# Patient Record
Sex: Female | Born: 1975 | Race: Black or African American | Hispanic: No | Marital: Married | Smoking: Never smoker
Health system: Southern US, Community
[De-identification: ages and names within clinical notes are randomized; demographics above are authoritative.]

## PROBLEM LIST (undated history)

## (undated) ENCOUNTER — Telehealth

## (undated) ENCOUNTER — Ambulatory Visit
Payer: PRIVATE HEALTH INSURANCE | Attending: Physical Medicine & Rehabilitation | Primary: Physical Medicine & Rehabilitation

## (undated) ENCOUNTER — Telehealth: Attending: Family | Primary: Family

## (undated) ENCOUNTER — Encounter

## (undated) ENCOUNTER — Ambulatory Visit: Payer: PRIVATE HEALTH INSURANCE

## (undated) ENCOUNTER — Encounter
Attending: Student in an Organized Health Care Education/Training Program | Primary: Student in an Organized Health Care Education/Training Program

## (undated) ENCOUNTER — Encounter: Attending: Physical Medicine & Rehabilitation | Primary: Physical Medicine & Rehabilitation

## (undated) ENCOUNTER — Ambulatory Visit

## (undated) ENCOUNTER — Encounter: Attending: Internal Medicine | Primary: Internal Medicine

## (undated) ENCOUNTER — Encounter: Attending: Clinical | Primary: Clinical

## (undated) ENCOUNTER — Encounter: Attending: Family | Primary: Family

## (undated) ENCOUNTER — Ambulatory Visit
Payer: BLUE CROSS/BLUE SHIELD | Attending: Physical Medicine & Rehabilitation | Primary: Physical Medicine & Rehabilitation

## (undated) ENCOUNTER — Ambulatory Visit: Payer: BLUE CROSS/BLUE SHIELD | Attending: Registered" | Primary: Registered"

## (undated) ENCOUNTER — Ambulatory Visit
Payer: PRIVATE HEALTH INSURANCE | Attending: Rehabilitative and Restorative Service Providers" | Primary: Rehabilitative and Restorative Service Providers"

## (undated) ENCOUNTER — Ambulatory Visit: Payer: PRIVATE HEALTH INSURANCE | Attending: Anesthesiology | Primary: Anesthesiology

## (undated) ENCOUNTER — Ambulatory Visit
Payer: BLUE CROSS/BLUE SHIELD | Attending: Student in an Organized Health Care Education/Training Program | Primary: Student in an Organized Health Care Education/Training Program

## (undated) ENCOUNTER — Ambulatory Visit: Payer: BLUE CROSS/BLUE SHIELD

## (undated) ENCOUNTER — Ambulatory Visit: Payer: BLUE CROSS/BLUE SHIELD | Attending: Family | Primary: Family

## (undated) ENCOUNTER — Ambulatory Visit: Payer: PRIVATE HEALTH INSURANCE | Attending: Family | Primary: Family

## (undated) ENCOUNTER — Ambulatory Visit: Payer: BLUE CROSS/BLUE SHIELD | Attending: Internal Medicine | Primary: Internal Medicine

## (undated) ENCOUNTER — Ambulatory Visit: Attending: Pharmacist | Primary: Pharmacist

## (undated) ENCOUNTER — Telehealth: Attending: Family Medicine | Primary: Family Medicine

## (undated) ENCOUNTER — Ambulatory Visit: Payer: PRIVATE HEALTH INSURANCE | Attending: Internal Medicine | Primary: Internal Medicine

## (undated) ENCOUNTER — Encounter: Attending: Family Medicine | Primary: Family Medicine

## (undated) ENCOUNTER — Encounter
Payer: PRIVATE HEALTH INSURANCE | Attending: Physical Medicine & Rehabilitation | Primary: Physical Medicine & Rehabilitation

## (undated) ENCOUNTER — Encounter
Payer: PRIVATE HEALTH INSURANCE | Attending: Student in an Organized Health Care Education/Training Program | Primary: Student in an Organized Health Care Education/Training Program

## (undated) ENCOUNTER — Encounter: Attending: Registered" | Primary: Registered"

## (undated) ENCOUNTER — Encounter
Payer: BLUE CROSS/BLUE SHIELD | Attending: Student in an Organized Health Care Education/Training Program | Primary: Student in an Organized Health Care Education/Training Program

## (undated) ENCOUNTER — Encounter: Attending: Anesthesiology | Primary: Anesthesiology

## (undated) ENCOUNTER — Other Ambulatory Visit

## (undated) ENCOUNTER — Telehealth
Attending: Student in an Organized Health Care Education/Training Program | Primary: Student in an Organized Health Care Education/Training Program

## (undated) ENCOUNTER — Encounter: Attending: Dermatology | Primary: Dermatology

## (undated) ENCOUNTER — Ambulatory Visit
Attending: Rehabilitative and Restorative Service Providers" | Primary: Rehabilitative and Restorative Service Providers"

## (undated) ENCOUNTER — Encounter: Attending: Diagnostic Radiology | Primary: Diagnostic Radiology

## (undated) ENCOUNTER — Telehealth: Attending: Nephrology | Primary: Nephrology

## (undated) ENCOUNTER — Ambulatory Visit: Attending: Family Medicine | Primary: Family Medicine

## (undated) ENCOUNTER — Ambulatory Visit: Payer: Medicaid (Managed Care)

## (undated) ENCOUNTER — Telehealth: Payer: PRIVATE HEALTH INSURANCE

## (undated) DIAGNOSIS — G43909 Migraine, unspecified, not intractable, without status migrainosus: Secondary | ICD-10-CM

---

## 2004-05-20 ENCOUNTER — Emergency Department: Payer: Self-pay | Admitting: Emergency Medicine

## 2004-06-24 ENCOUNTER — Emergency Department: Payer: Self-pay | Admitting: Emergency Medicine

## 2005-03-21 ENCOUNTER — Other Ambulatory Visit: Payer: Self-pay

## 2005-03-21 ENCOUNTER — Emergency Department: Payer: Self-pay | Admitting: Emergency Medicine

## 2005-03-26 ENCOUNTER — Emergency Department: Payer: Self-pay | Admitting: Emergency Medicine

## 2005-05-24 ENCOUNTER — Emergency Department: Payer: Self-pay | Admitting: Emergency Medicine

## 2006-02-06 ENCOUNTER — Other Ambulatory Visit: Payer: Self-pay

## 2006-02-06 ENCOUNTER — Emergency Department: Payer: Self-pay | Admitting: Emergency Medicine

## 2006-04-04 ENCOUNTER — Emergency Department: Payer: Self-pay | Admitting: General Practice

## 2006-05-12 ENCOUNTER — Emergency Department: Payer: Self-pay | Admitting: Emergency Medicine

## 2006-06-25 ENCOUNTER — Emergency Department: Payer: Self-pay | Admitting: Emergency Medicine

## 2006-07-13 ENCOUNTER — Emergency Department: Payer: Self-pay | Admitting: Emergency Medicine

## 2006-08-07 ENCOUNTER — Emergency Department: Payer: Self-pay

## 2006-10-05 ENCOUNTER — Emergency Department: Payer: Self-pay | Admitting: Unknown Physician Specialty

## 2007-01-04 ENCOUNTER — Observation Stay: Payer: Self-pay | Admitting: General Surgery

## 2007-01-15 ENCOUNTER — Ambulatory Visit: Payer: Self-pay | Admitting: Obstetrics & Gynecology

## 2007-01-21 ENCOUNTER — Ambulatory Visit: Payer: Self-pay | Admitting: Obstetrics & Gynecology

## 2007-05-30 ENCOUNTER — Emergency Department: Payer: Self-pay | Admitting: Unknown Physician Specialty

## 2007-08-14 ENCOUNTER — Emergency Department: Payer: Self-pay | Admitting: Internal Medicine

## 2008-04-26 ENCOUNTER — Emergency Department: Payer: Self-pay | Admitting: Emergency Medicine

## 2009-01-08 ENCOUNTER — Emergency Department: Payer: Self-pay | Admitting: Internal Medicine

## 2009-02-28 ENCOUNTER — Emergency Department: Payer: Self-pay | Admitting: Emergency Medicine

## 2009-06-22 ENCOUNTER — Emergency Department: Payer: Self-pay | Admitting: Emergency Medicine

## 2011-01-14 IMAGING — CT CT HEAD WITHOUT CONTRAST
2 series · 16 of 30 positions shown, 20 images · non-contrast
Comparison: none

REASON FOR EXAM: ha dizzy
COMMENTS:

PROCEDURE:     CT  - CT HEAD WITHOUT CONTRAST  - January 08, 2009  [DATE]
RESULT:     Comparison:  None
TECHNIQUE: Multiple axial images from the foramen magnum to the vertex were
obtained without IV contrast.

[Series 2: without · axial · non-contrast · 0.38mm/px · z∈[+479,+599]mm · 13 of 29 slices shown, 17 images]
[im 3/29  brain]
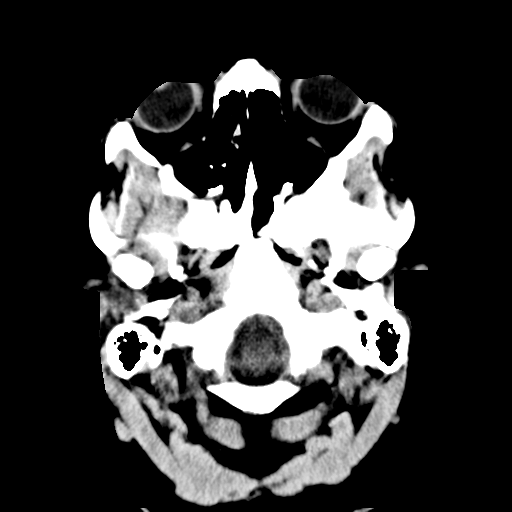
[im 3/29  bone]
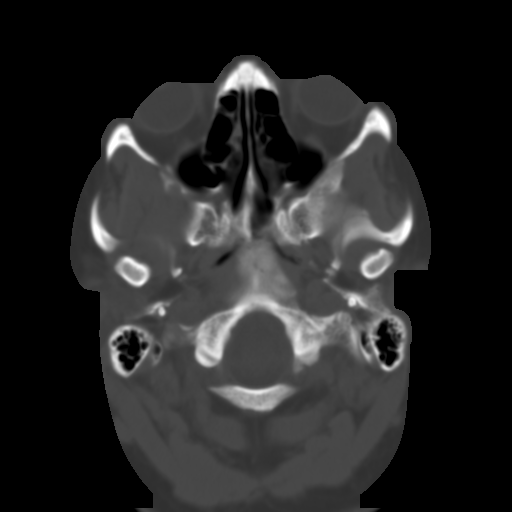
[im 5/29  brain]
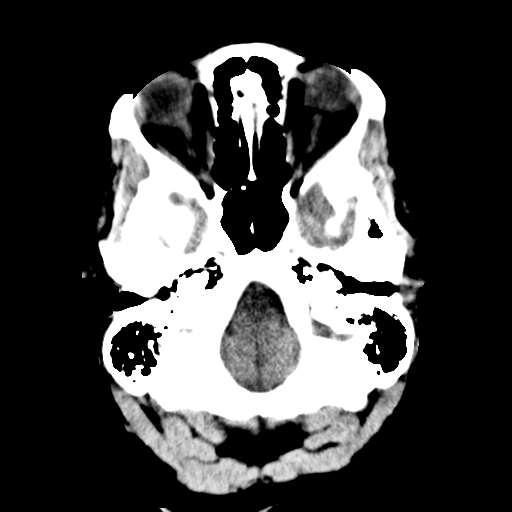
[im 7/29  brain]
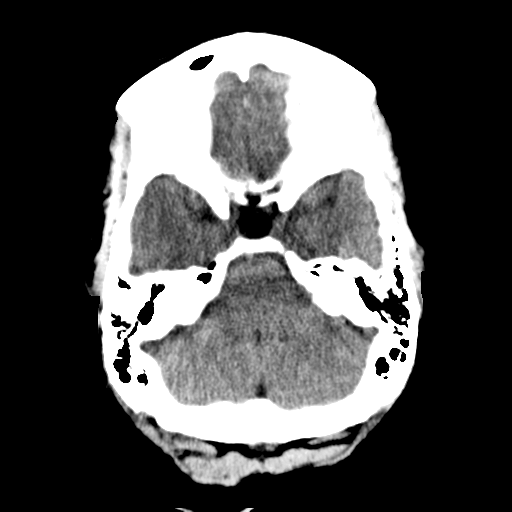
[im 9/29  brain]
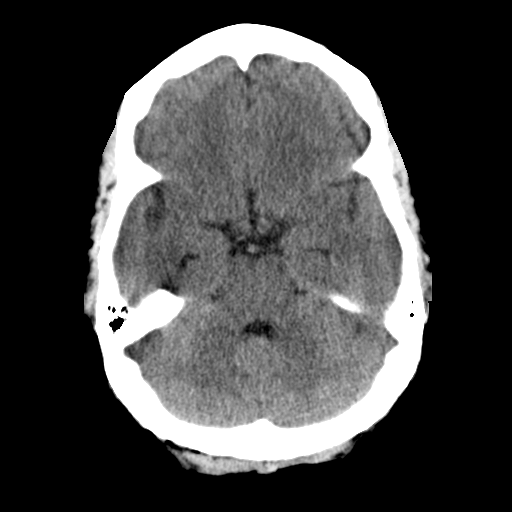
[im 11/29  brain]
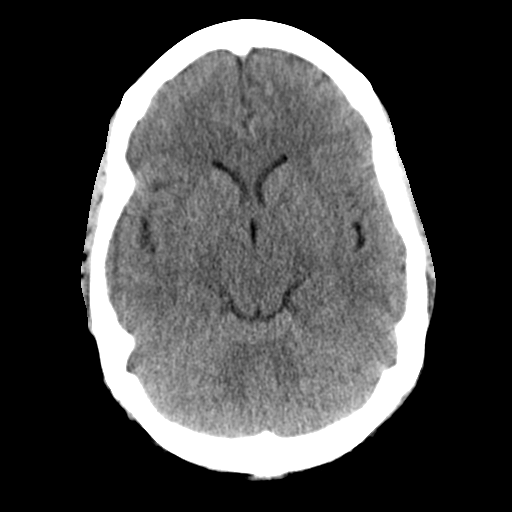
[im 11/29  bone]
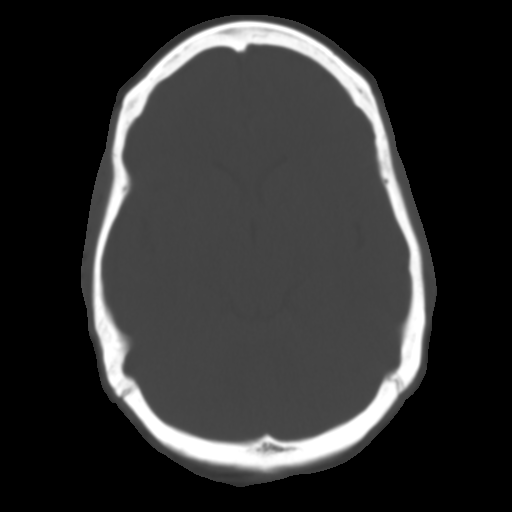
[im 13/29  brain]
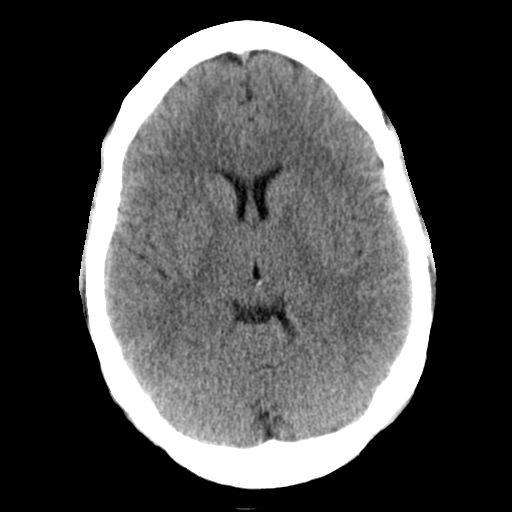
[im 15/29  brain]
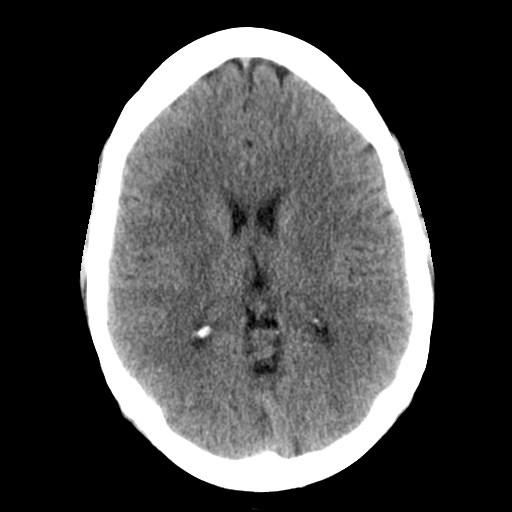
[im 17/29  brain]
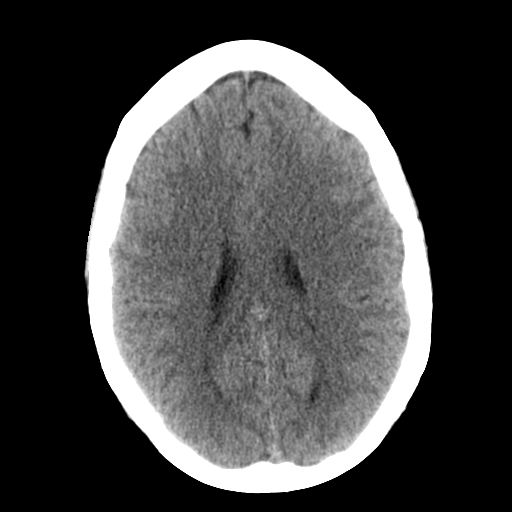
[im 19/29  brain]
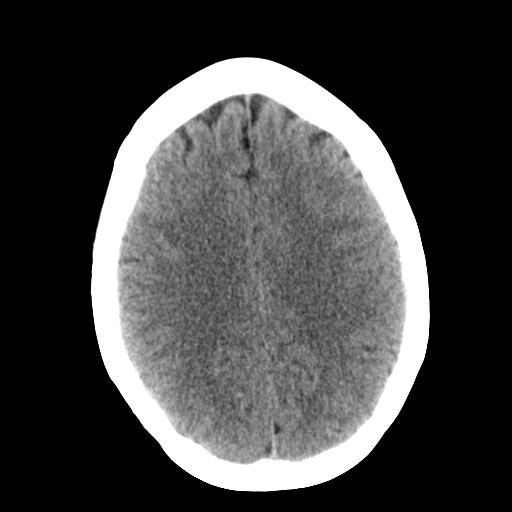
[im 19/29  bone]
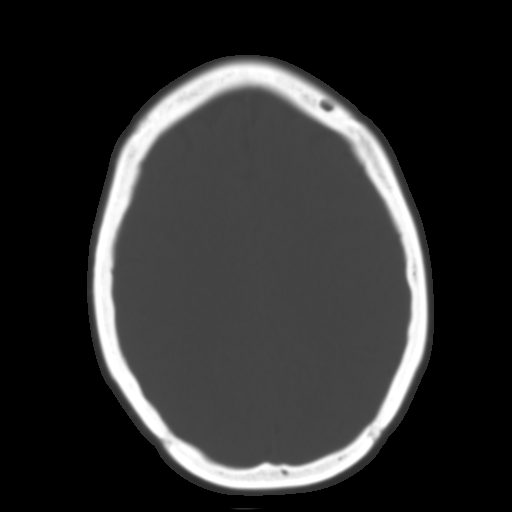
[im 21/29  brain]
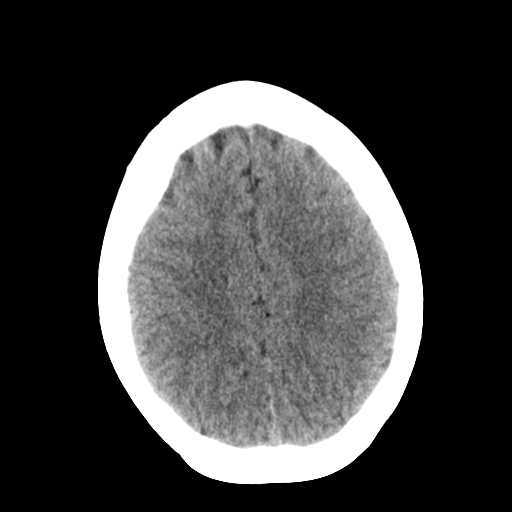
[im 23/29  brain]
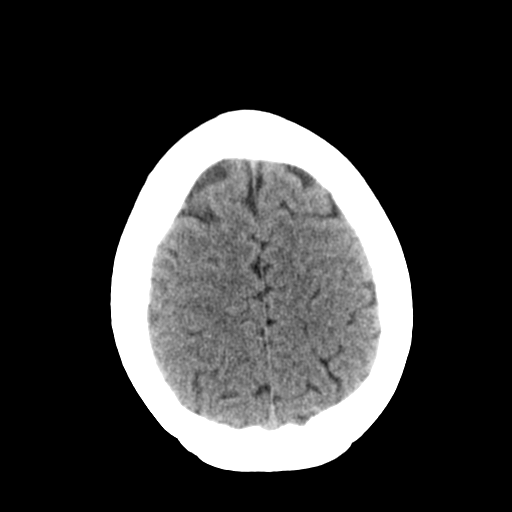
[im 25/29  brain]
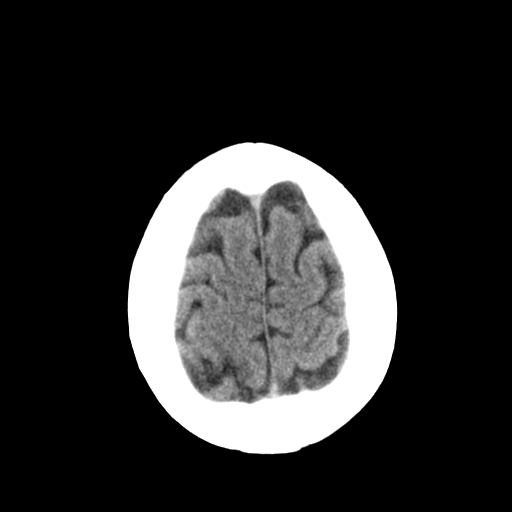
[im 27/29  brain]
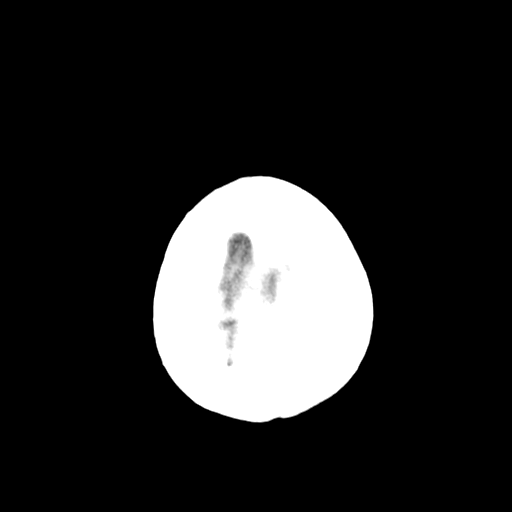
[im 27/29  bone]
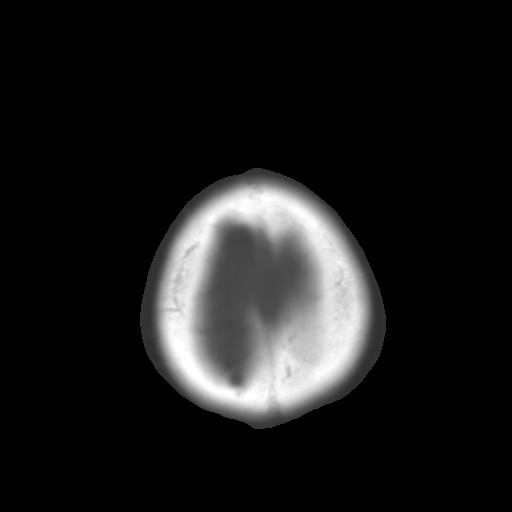

[Series 3: bone · axial · 0.38mm/px · z∈[+479,+519]mm · 3 of 29 slices shown]
[im 3/29  bone]
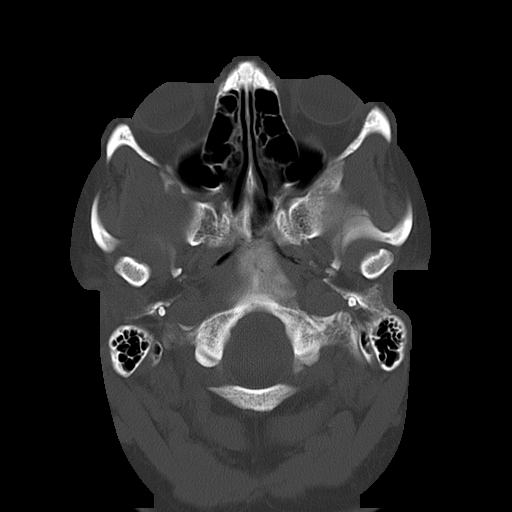
[im 7/29  bone]
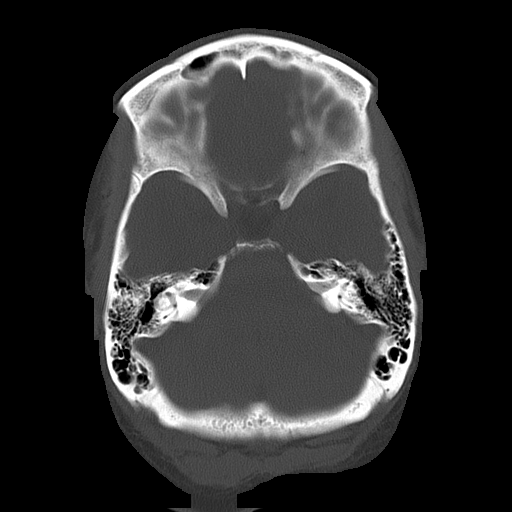
[im 11/29  bone]
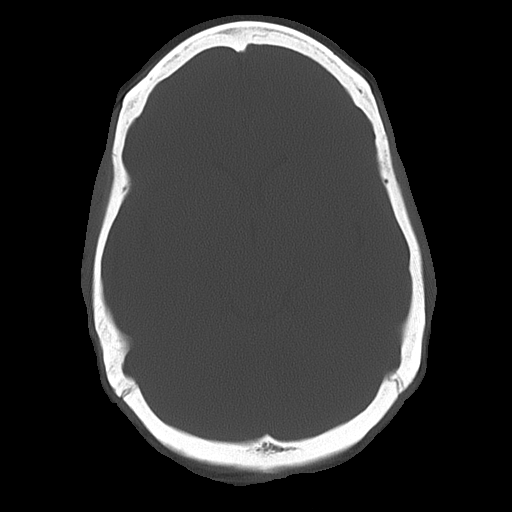

[16 of 30 positions shown; findings below may reference images not displayed]

FINDINGS: There is no evidence of mass effect, midline shift, or extra-axial fluid
collections.  There is no evidence of a space-occupying lesion or
intracranial hemorrhage. There is no evidence of a cortical-based area of
acute infarction.

The ventricles and sulci are appropriate for the patient's age. The basal
cisterns are patent.

Visualized portions of the orbits are unremarkable. The paranasal sinuses
and mastoid air cells are unremarkable.

The osseous structures are unremarkable.
IMPRESSION: No acute intracranial process.

## 2011-04-11 ENCOUNTER — Inpatient Hospital Stay: Payer: Self-pay | Admitting: Internal Medicine

## 2011-05-24 ENCOUNTER — Emergency Department: Payer: Self-pay | Admitting: *Deleted

## 2011-06-21 ENCOUNTER — Ambulatory Visit: Payer: Self-pay | Admitting: Gastroenterology

## 2011-09-24 ENCOUNTER — Emergency Department: Payer: Self-pay | Admitting: Emergency Medicine

## 2012-01-21 ENCOUNTER — Ambulatory Visit: Payer: Self-pay | Admitting: Obstetrics and Gynecology

## 2012-01-24 ENCOUNTER — Emergency Department: Payer: Self-pay | Admitting: Emergency Medicine

## 2012-01-30 ENCOUNTER — Ambulatory Visit: Payer: Self-pay | Admitting: Obstetrics and Gynecology

## 2012-03-20 ENCOUNTER — Ambulatory Visit: Payer: Self-pay | Admitting: Obstetrics and Gynecology

## 2012-03-20 LAB — BASIC METABOLIC PANEL
Anion Gap: 8 (ref 7–16)
Calcium, Total: 8.7 mg/dL (ref 8.5–10.1)
Co2: 27 mmol/L (ref 21–32)
Creatinine: 0.7 mg/dL (ref 0.60–1.30)
EGFR (African American): >60
Osmolality: 280 (ref 275–301)
Sodium: 141 mmol/L (ref 136–145)

## 2012-03-20 LAB — PREGNANCY, URINE: Pregnancy Test, Urine: NEGATIVE m[IU]/mL

## 2012-03-27 ENCOUNTER — Ambulatory Visit: Payer: Self-pay | Admitting: Obstetrics and Gynecology

## 2012-03-30 ENCOUNTER — Inpatient Hospital Stay: Payer: Self-pay | Admitting: Obstetrics and Gynecology

## 2012-03-30 LAB — URINALYSIS, COMPLETE
Leukocyte Esterase: NEGATIVE
Nitrite: NEGATIVE
Ph: 5 (ref 4.5–8.0)
Protein: 100
RBC,UR: 1 /HPF (ref 0–5)
Specific Gravity: 1.029 (ref 1.003–1.030)
Squamous Epithelial: 2

## 2012-03-30 LAB — COMPREHENSIVE METABOLIC PANEL
Anion Gap: 12 (ref 7–16)
Bilirubin,Total: 1.7 mg/dL — ABNORMAL HIGH (ref 0.2–1.0)
Chloride: 103 mmol/L (ref 98–107)
Co2: 21 mmol/L (ref 21–32)
Creatinine: 1.07 mg/dL (ref 0.60–1.30)
EGFR (African American): >60
EGFR (Non-African Amer.): >60
Osmolality: 273 (ref 275–301)
Potassium: 2.8 mmol/L — ABNORMAL LOW (ref 3.5–5.1)
SGPT (ALT): 17 U/L (ref 12–78)
Sodium: 136 mmol/L (ref 136–145)

## 2012-03-30 LAB — CBC
HCT: 35.2 % (ref 35.0–47.0)
HGB: 11.7 g/dL — ABNORMAL LOW (ref 12.0–16.0)
MCHC: 33.1 g/dL (ref 32.0–36.0)
MCV: 81 fL (ref 80–100)
Platelet: 144 10*3/uL — ABNORMAL LOW (ref 150–440)
RDW: 15.2 % — ABNORMAL HIGH (ref 11.5–14.5)
WBC: 16.8 10*3/uL — ABNORMAL HIGH (ref 3.6–11.0)

## 2012-03-30 LAB — MAGNESIUM: Magnesium: 1.3 mg/dL — ABNORMAL LOW

## 2012-03-30 LAB — LIPASE, BLOOD: Lipase: 101 U/L (ref 73–393)

## 2012-03-31 LAB — CBC WITH DIFFERENTIAL/PLATELET
Basophil %: 0.3 %
Eosinophil #: 0 10*3/uL (ref 0.0–0.7)
MCH: 26.4 pg (ref 26.0–34.0)
MCHC: 31.9 g/dL — ABNORMAL LOW (ref 32.0–36.0)
Monocyte #: 1.6 x10 3/mm — ABNORMAL HIGH (ref 0.2–0.9)
Monocyte %: 10.1 %
Neutrophil %: 80.3 %
Platelet: 107 10*3/uL — ABNORMAL LOW (ref 150–440)
RDW: 15 % — ABNORMAL HIGH (ref 11.5–14.5)

## 2012-03-31 LAB — BASIC METABOLIC PANEL
Anion Gap: 8 (ref 7–16)
Calcium, Total: 7.7 mg/dL — ABNORMAL LOW (ref 8.5–10.1)
Chloride: 105 mmol/L (ref 98–107)
Co2: 26 mmol/L (ref 21–32)
Creatinine: 0.77 mg/dL (ref 0.60–1.30)
EGFR (African American): >60
Osmolality: 275 (ref 275–301)

## 2012-04-01 LAB — CBC WITH DIFFERENTIAL/PLATELET
Basophil #: 0.1 10*3/uL (ref 0.0–0.1)
Lymphocyte #: 1.5 10*3/uL (ref 1.0–3.6)
Lymphocyte %: 15.9 %
MCV: 82 fL (ref 80–100)
Monocyte %: 11.9 %
Platelet: 104 10*3/uL — ABNORMAL LOW (ref 150–440)
RDW: 15.2 % — ABNORMAL HIGH (ref 11.5–14.5)
WBC: 9.6 10*3/uL (ref 3.6–11.0)

## 2012-04-01 LAB — GENTAMICIN LEVEL, PEAK: Gentamicin, Peak: 4.9 ug/mL (ref 4.0–8.0)

## 2012-04-01 LAB — URINE CULTURE

## 2012-04-01 LAB — GENTAMICIN LEVEL, TROUGH: Gentamicin, Trough: 0.5 ug/mL (ref 0.0–2.0)

## 2012-04-02 LAB — CBC WITH DIFFERENTIAL/PLATELET
Basophil: 1 %
HCT: 30 % — ABNORMAL LOW (ref 35.0–47.0)
MCH: 27.2 pg (ref 26.0–34.0)
MCHC: 33.4 g/dL (ref 32.0–36.0)
MCV: 81 fL (ref 80–100)
Platelet: 133 10*3/uL — ABNORMAL LOW (ref 150–440)
RBC: 3.69 10*6/uL — ABNORMAL LOW (ref 3.80–5.20)
RDW: 15.2 % — ABNORMAL HIGH (ref 11.5–14.5)
Segmented Neutrophils: 57 %
Variant Lymphocyte - H1-Rlymph: 2 %
WBC: 8.3 10*3/uL (ref 3.6–11.0)

## 2012-04-02 LAB — VANCOMYCIN, TROUGH: Vancomycin, Trough: 8 ug/mL — ABNORMAL LOW (ref 10–20)

## 2012-04-03 LAB — CBC WITH DIFFERENTIAL/PLATELET
Basophil #: 0 10*3/uL (ref 0.0–0.1)
Eosinophil #: 0.2 10*3/uL (ref 0.0–0.7)
Eosinophil %: 1.7 %
Lymphocyte %: 18.2 %
MCHC: 32.9 g/dL (ref 32.0–36.0)
Monocyte %: 9.3 %
Neutrophil %: 70.6 %
Platelet: 170 10*3/uL (ref 150–440)
RBC: 3.8 10*6/uL (ref 3.80–5.20)
RDW: 15.3 % — ABNORMAL HIGH (ref 11.5–14.5)

## 2012-04-04 LAB — CULTURE, BLOOD (SINGLE)

## 2013-01-17 ENCOUNTER — Emergency Department: Payer: Self-pay | Admitting: Emergency Medicine

## 2013-05-15 ENCOUNTER — Ambulatory Visit: Payer: Self-pay | Admitting: Nurse Practitioner

## 2014-09-21 NOTE — Consult Note (Signed)
PATIENT NAME:  Jarrett AblesFULP, Regina R MR#:  [id number] DATE OF BIRTH:  03-Oct-1975  DATE OF CONSULTATION:  04/01/2012  REFERRING PHYSICIAN:  Vena AustriaAndreas Staebler, MD  CONSULTING PHYSICIAN:  Rosalyn GessMichael E. Davonne Baby, MD  REASON FOR CONSULTATION: Gram-positive cocci in the blood.   HISTORY OF PRESENT ILLNESS: The patient is a 39 year old black female with a past history significant for menorrhagia, status post recent endometrial ablation on 10/24. The procedure was unremarkable, and the patient appeared to tolerate the procedure fairly well. There was noted to be a moderate amount of free fluid in the cul-de-sac. The pathology report indicated late proliferative early secretory phase endometrium but negative for hyperplasia or malignancy. She initially did well and went home; however, on the day before admission she was found to be febrile and confused by family members and brought to the Emergency Room. She does not recall the events leading up to her hospitalization since the procedure; but she was, per the History and Physical, also having some lower abdominal pain. She was having some vaginal discharge as expected following the recent procedure. She was admitted to the hospital and started on gentamicin and clindamycin for possible endometritis. GC and chlamydia testing were obtained and were negative. Blood cultures are currently growing gram-positive cocci, and the urine culture is growing Proteus although the urinalysis was entirely negative except for 1+ blood. The patient has clinically improved. Her fevers have improved. Her abdominal discomfort has improved, and her confusion has resolved. Her white count was 16.8 on admission and has come down to 9.6. Vancomycin was added when the blood culture results came back.   ALLERGIES: NSAIDs.   PAST MEDICAL/SURGICAL HISTORY:  1. Asthma.  2. Menorrhagia, status post recent endometrial ablation.   SOCIAL HISTORY: The patient lives at home with her husband and four  children. She does not smoke. She does not drink. No history of injecting drug use. She has an outside dog at home.   FAMILY HISTORY: Positive for hypertension, diabetes and Crohn's disease.    REVIEW OF SYSTEMS: GENERAL: Positive fevers and chills. She does not recall any sweats or malaise. HEENT: She denies currently any headache, sinus congestion other than her baseline chronic congestion. No sore throat. NECK: No stiffness. No swollen glands. RESPIRATORY: No cough, no shortness of breath. No sputum production. CARDIAC: No chest pains or palpitations. GI: No nausea, no vomiting, and no change in her bowels. She does have some lower abdominal pain. GU: No change in her urine. She denies any burning or itching. No increased frequency. MUSCULOSKELETAL: No complaints. SKIN: No rashes. NEUROLOGIC: No complaints currently, but apparently she was found confused at home. PSYCHIATRIC: No complaints. All other systems are negative.   PHYSICAL EXAMINATION:  VITAL SIGNS: T-max of 102.2, pulse 83, T-current 98.0, pulse 83, blood pressure 131/83, 99% on room air.   GENERAL: A 39 year old obese black female in no acute distress.   HEENT: Normocephalic, atraumatic. Pupils are equal and reactive to light. Extraocular motion intact. Sclerae, conjunctivae, and lids are without evidence for emboli or petechiae. Oropharynx shows no erythema or exudate. Teeth and gums are in good condition.   NECK: Supple. Full range of motion. Midline trachea. No lymphadenopathy. No thyromegaly.   LUNGS: Clear to auscultation bilaterally with good air movement. No focal consolidation.   HEART: Regular rate and rhythm without murmur, rub, or gallop.   ABDOMEN: Soft, mildly tender in the infraumbilical area but no rebound or guarding. No splenomegaly was appreciated. No hernias were noted.   EXTREMITIES:  No evidence for tenosynovitis.   SKIN: No rashes. No stigmata of endocarditis, specifically no Janeway lesions nor Osler nodes.    NEUROLOGIC: The patient is awake and interactive, moving all four extremities.   PSYCHIATRIC: Mood and affect appeared normal.   LABORATORY, DIAGNOSTIC AND RADIOLOGICAL DATA: BUN 7, creatinine 0.77, bicarbonate 26, anion gap of 8.0. White count today was 9.6 with a hemoglobin 10.3, platelet count 104, ANC of 6.6. White count on admission was 16.8. Blood cultures from admission are growing two out of four bottles positive for gram-positive cocci. Wet prep demonstrated no trichomonas, no yeast. Clue cells were noted. A urinalysis had 1+ blood, 100 mg/dL protein, negative nitrites, negative leukocyte esterase, one red cell, four white cells per high-powered field. Urine culture is growing Proteus mirabilis with mixed bacterial organisms present. GC and Chlamydia were negative. Urine pregnancy test was negative. A chest x-ray from admission showed evidence for pneumoperitoneum. There were no infiltrates noted. A CT scan of the abdomen and pelvis without contrast showed pneumoperitoneum.   IMPRESSION: A 39 year old black female with a past history significant for menorrhagia and recent endometrial ablation who was admitted with fever, confusion and has gram-positive cocci in the blood.   RECOMMENDATIONS:  1. She recently had a procedure and presented with fever, confusion and abdominal pain. Endometritis is a possible explanation for her symptoms. She has been broadly covered with antibiotics, and her mentation and fever have improved. Her white count has come down as well. GYN has indicated that the pneumoperitoneum would be expected following the recent procedure. She does not have any evidence for an acute abdomen or perforation clinically.  2. Her blood cultures are growing gram-positive cocci. I expect that this will be coagulase-negative staph, but Staphylococcus aureus or strep species are still possible. I agree with vancomycin until the culture returns.  3. We will change her other antibiotics to  cefoxitin. This will have broad coverage and will not have the potential renal side effects of an aminoglycoside.  4. Her urine culture is growing Proteus, but her urinalysis was unremarkable and there were other bacteria noted on the culture.  I would not focus therapy on the urinary isolate.   This is a moderately complex Infectious Disease case.   Thank you very much for involving me in Ms. Bruning's care   ____________________________ Rosalyn Gess. Jouri Threat, MD meb:[id number] D: 04/01/2012 16:55:00 ET T: 04/01/2012 17:25:50 ET JOB#: [id number]  cc: Rosalyn Gess. Gamble Enderle, MD, <Dictator> Glendel Jaggers E Liyla Radliff MD ELECTRONICALLY SIGNED 04/04/2012 10:41

## 2014-09-21 NOTE — Consult Note (Signed)
Impression: 39yo BF w/ h/o menorrhagia, s/p endometrial ablation admitted with fever and confusion and who has GPC in blood.  She recently had a procedure and presented with fever, confusion and abd pain.  Endometritis is a possible explanation for her symptoms.  She has been broadly covered with antibiotics and her mentation and fever have improved.  Her WBC has come down as well. Her BCx are growing GPC.  I suspect that these will be CNS, but Staph aureus or Strep spp are still possible.  Agree with vanco until the culture returns. Will change her other antibiotics to cefoxitin.  This will not have the potential renal side effects of an aminoglycoside. Her urine culture is growing Proteus, but her u/a was unremarkable.  I would not focus therapy on the urine isolate.  Electronic Signatures: Zorion Nims, Rosalyn GessMichael E (MD)  (Signed on 29-Oct-13 16:43)  Authored  Last Updated: 29-Oct-13 16:43 by Arnella Pralle, Rosalyn GessMichael E (MD)

## 2014-09-21 NOTE — H&P (Signed)
Subjective/Chief Complaint abdominal pain    History of Present Illness 39 yo [id number] who is POD#3 s/p diagnostic laparoscopy, hysteroscopy, D&C, and endometrial ablation by Dr. Bonney AidStaebler.  The surgery was reportedly uncomplicated with no specific findings.  She was discharged the same day.  Yesterday she began having abdominal pain that worsened today.  At home she has had subjective fevers and chills.  She, therefore, presented to the ED for further workup.  She denies nausea/vomiting, chest pain, trouble breathing, diarrhea, constipation, incision issues, urinary symptoms, and abnormal vaginal symptoms apart from the discharge she has had since the procedure.    Past Medical Health Asthma   Past Med/Surgical Hx:  Migraine Headaches:   Anemia:   Asthma:   Tubal Ligation:   ALLERGIES:  NSAIDS: Rash, GI Distress  HOME MEDICATIONS: Medication Instructions Status  Percocet 5/325 oral tablet 1 tab(s) orally every 4 hours, As Needed- for Pain  Active   Family and Social History:   Family History Non-Contributory    Social History negative tobacco, negative ETOH, negative Illicit drugs    Place of Living Home   Review of Systems:   Subjective/Chief Complaint negative x 10 systems reviewed unless noted in HPI    Medications/Allergies Reviewed Medications/Allergies reviewed   Physical Exam:   GEN obese, disheveled, mild-moderate distress    NECK supple    RESP clear BS    CARD regular rate    ABD positive tenderness  no liver/spleen enlargement  soft  normal BS  tender to palpation in suprapubic area, no rebound, + voluntary guarding    GU superpubic tenderness    EXTR negative edema    SKIN No rashes, Incisions in umbilicus and LLQ are clean/dry/intact with dermabdon in place.  No erythema, warmth, induration, or tenderness    PSYCH alert, good insight, agitated    Additional Comments Inital vitals in ED: Temp: 100.6, Pulse 115, RR 24, BP 135/65, O2 sats 96% RA    Lab Results: Hepatic:  27-Oct-13 13:48    Alkaline Phosphatase 64   SGPT (ALT) 17   SGOT (AST) 25  Lab:  27-Oct-13 15:55    Lactic Acid, Cardiopulmonary 1.3 (Result(s) reported on 30 Mar 2012 at 03:58PM.)  Routine Chem:  27-Oct-13 13:48    Magnesium, Serum  1.3 (1.8-2.4 THERAPEUTIC RANGE: 4-7 mg/dL TOXIC: > 10 mg/dL  -----------------------)   Lipase 101 (Result(s) reported on 30 Mar 2012 at 02:18PM.)   Creatinine (comp) 1.07   Potassium, Serum  2.8   Anion Gap 12  Routine UA:  27-Oct-13 13:48    Color (UA) Amber   Clarity (UA) Hazy   Glucose (UA) Negative   Bilirubin (UA) Negative   Ketones (UA) Trace   Specific Gravity (UA) 1.029   Blood (UA) 1+   pH (UA) 5.0   Protein (UA) 100 mg/dL   Nitrite (UA) Negative   Leukocyte Esterase (UA) Negative (Result(s) reported on 30 Mar 2012 at 02:40PM.)   RBC (UA) 1 /HPF   WBC (UA) 4 /HPF   Bacteria (UA) TRACE   Epithelial Cells (UA) 2 /HPF   Mucous (UA) PRESENT (Result(s) reported on 30 Mar 2012 at 02:40PM.)     Assessment/Admission Diagnosis 39 yo [id number] who is s/p diagnostic laparoscopy, hysteroscopy, and endometrial ablation, admitted with likely endometritis.  Although endometritis is quite rare in hysteroscopy, she has demonstrated a fever, uterine tenderness, and cervical motion tenderness, all in the setting of recent surgery.  Will admit for observation for now and treat  for endometritis.  She has had a CT scan with no other etiology to her pain identified. She did have free air under her diaphragm.  This is expected given the proximity to her diagnostic laparoscopy.    Plan - admit for observation - begin with broad spectrum antibiotics (clinda and gentamicin). - percocet for pain, initially. will adjust as needed. - regular diet. - sent gonorrhea/chlamydia NAA. - made Dr Bonney Aid aware pt admitted.   Electronic Signatures: Conard Novak (MD)  (Signed 27-Oct-13 18:25)  Authored: CHIEF COMPLAINT and HISTORY,  PAST MEDICAL/SURGIAL HISTORY, ALLERGIES, HOME MEDICATIONS, FAMILY AND SOCIAL HISTORY, REVIEW OF SYSTEMS, PHYSICAL EXAM, LABS, ASSESSMENT AND PLAN   Last Updated: 27-Oct-13 18:25 by Conard Novak (MD)

## 2017-01-09 MED FILL — PHENTERMINE/37.5MG/TAB: PHENTERMINE/37.5MG/TAB | 30 days supply | Qty: 30 | Fill #1

## 2017-06-26 ENCOUNTER — Encounter: Admit: 2017-06-26 | Discharge: 2017-06-27 | Payer: PRIVATE HEALTH INSURANCE | Attending: Family | Primary: Family

## 2017-06-26 DIAGNOSIS — E669 Obesity, unspecified: Principal | ICD-10-CM

## 2017-06-26 DIAGNOSIS — Z1321 Encounter for screening for nutritional disorder: Secondary | ICD-10-CM

## 2017-06-26 DIAGNOSIS — Z9884 Bariatric surgery status: Secondary | ICD-10-CM

## 2018-02-12 ENCOUNTER — Encounter
Admit: 2018-02-12 | Discharge: 2018-02-13 | Payer: PRIVATE HEALTH INSURANCE | Attending: Dermatology | Primary: Dermatology

## 2018-02-12 DIAGNOSIS — R21 Rash and other nonspecific skin eruption: Principal | ICD-10-CM

## 2018-02-12 DIAGNOSIS — L709 Acne, unspecified: Secondary | ICD-10-CM

## 2018-02-12 MED ORDER — CLINDAMYCIN 1 % LOTION
Freq: Every morning | TOPICAL | 5 refills | 0.00000 days | Status: CP
Start: 2018-02-12 — End: ?

## 2018-02-12 MED ORDER — TRETINOIN 0.025 % TOPICAL CREAM
Freq: Every evening | TOPICAL | 5 refills | 0 days | Status: CP
Start: 2018-02-12 — End: 2018-07-25

## 2018-03-05 ENCOUNTER — Encounter
Admit: 2018-03-05 | Discharge: 2018-03-05 | Disposition: A | Discharge status: Home / Self Care | Payer: PRIVATE HEALTH INSURANCE | Attending: Family

## 2018-03-11 MED ORDER — METHYLPREDNISOLONE 4 MG TABLETS IN A DOSE PACK
ORAL_TABLET | Freq: Every day | ORAL | 0 refills | 0 days | Status: CP
Start: 2018-03-11 — End: 2018-03-12

## 2018-03-12 MED ORDER — METHYLPREDNISOLONE 4 MG TABLETS IN A DOSE PACK
ORAL_TABLET | Freq: Every day | ORAL | 0 refills | 0 days | Status: CP
Start: 2018-03-12 — End: 2018-04-04

## 2018-04-04 ENCOUNTER — Ambulatory Visit: Admit: 2018-04-04 | Discharge: 2018-04-05 | Payer: PRIVATE HEALTH INSURANCE | Attending: Family | Primary: Family

## 2018-04-04 DIAGNOSIS — Z1321 Encounter for screening for nutritional disorder: Secondary | ICD-10-CM

## 2018-04-04 DIAGNOSIS — E669 Obesity, unspecified: Principal | ICD-10-CM

## 2018-04-04 DIAGNOSIS — Z9884 Bariatric surgery status: Secondary | ICD-10-CM

## 2018-04-11 ENCOUNTER — Ambulatory Visit: Admit: 2018-04-11 | Discharge: 2018-04-11 | Payer: PRIVATE HEALTH INSURANCE

## 2018-04-11 DIAGNOSIS — N946 Dysmenorrhea, unspecified: Secondary | ICD-10-CM

## 2018-04-11 DIAGNOSIS — N939 Abnormal uterine and vaginal bleeding, unspecified: Principal | ICD-10-CM

## 2018-04-11 MED ORDER — NORETHINDRONE ACETATE 5 MG TABLET
ORAL_TABLET | Freq: Every day | ORAL | 3 refills | 0 days | Status: CP
Start: 2018-04-11 — End: 2018-06-10

## 2018-04-16 MED ORDER — DOXYCYCLINE HYCLATE 100 MG TABLET/CAPSULE WRAPPER
Freq: Two times a day (BID) | ORAL | 0 refills | 0.00000 days | Status: CP
Start: 2018-04-16 — End: 2018-04-26

## 2018-04-25 ENCOUNTER — Encounter: Admit: 2018-04-25 | Discharge: 2018-04-26 | Payer: PRIVATE HEALTH INSURANCE

## 2018-04-25 ENCOUNTER — Ambulatory Visit: Admit: 2018-04-25 | Discharge: 2018-04-26 | Payer: PRIVATE HEALTH INSURANCE

## 2018-04-25 DIAGNOSIS — N939 Abnormal uterine and vaginal bleeding, unspecified: Principal | ICD-10-CM

## 2018-04-25 DIAGNOSIS — Z1231 Encounter for screening mammogram for malignant neoplasm of breast: Secondary | ICD-10-CM

## 2018-04-30 MED ORDER — PHENTERMINE 15 MG CAPSULE
ORAL_CAPSULE | Freq: Every morning | ORAL | 2 refills | 0 days | Status: CP
Start: 2018-04-30 — End: 2018-05-06

## 2018-05-06 MED ORDER — PHENTERMINE 15 MG CAPSULE
ORAL_CAPSULE | Freq: Every morning | ORAL | 2 refills | 0.00000 days | Status: CP
Start: 2018-05-06 — End: 2019-01-19
  Filled 2018-05-07: qty 30, 30d supply, fill #0

## 2018-05-07 MED FILL — PHENTERMINE 15 MG CAPSULE: 30 days supply | Qty: 30 | Fill #0 | Status: AC

## 2018-07-04 ENCOUNTER — Encounter: Admit: 2018-07-04 | Discharge: 2018-07-05 | Payer: PRIVATE HEALTH INSURANCE

## 2018-07-04 DIAGNOSIS — N939 Abnormal uterine and vaginal bleeding, unspecified: Secondary | ICD-10-CM

## 2018-07-04 DIAGNOSIS — N946 Dysmenorrhea, unspecified: Principal | ICD-10-CM

## 2018-07-28 MED ORDER — TRETINOIN 0.025 % TOPICAL CREAM
Freq: Every evening | TOPICAL | 5 refills | 0 days | Status: CP
Start: 2018-07-28 — End: ?

## 2018-07-29 MED ORDER — METHYLPREDNISOLONE 4 MG TABLETS IN A DOSE PACK
PACK | Freq: Every day | ORAL | 0 refills | 0.00000 days | Status: CP
Start: 2018-07-29 — End: 2019-07-29

## 2018-12-30 ENCOUNTER — Encounter: Admit: 2018-12-30 | Discharge: 2018-12-31 | Payer: PRIVATE HEALTH INSURANCE

## 2018-12-30 DIAGNOSIS — E669 Obesity, unspecified: Secondary | ICD-10-CM

## 2018-12-30 DIAGNOSIS — Z9884 Bariatric surgery status: Secondary | ICD-10-CM

## 2018-12-31 ENCOUNTER — Encounter: Admit: 2018-12-31 | Discharge: 2019-01-01 | Payer: PRIVATE HEALTH INSURANCE | Attending: Family | Primary: Family

## 2018-12-31 ENCOUNTER — Encounter: Admit: 2018-12-31 | Discharge: 2019-01-01 | Payer: PRIVATE HEALTH INSURANCE

## 2018-12-31 DIAGNOSIS — E669 Obesity, unspecified: Principal | ICD-10-CM

## 2018-12-31 DIAGNOSIS — Z1321 Encounter for screening for nutritional disorder: Secondary | ICD-10-CM

## 2018-12-31 DIAGNOSIS — Z20828 Contact with and (suspected) exposure to other viral communicable diseases: Principal | ICD-10-CM

## 2018-12-31 DIAGNOSIS — Z9884 Bariatric surgery status: Secondary | ICD-10-CM

## 2019-01-19 MED ORDER — PEN NEEDLE, DIABETIC 32 GAUGE X 5/32" (4 MM)
Freq: Every day | 1 refills | 1 days | Status: CP
Start: 2019-01-19 — End: ?

## 2019-01-19 MED ORDER — LIRAGLUTIDE (WEIGHT LOSS) 3 MG/0.5 ML (18 MG/3 ML) SUBCUT PEN INJECTOR
Freq: Every day | SUBCUTANEOUS | 1 refills | 30 days | Status: CP
Start: 2019-01-19 — End: ?

## 2019-03-25 DIAGNOSIS — E669 Obesity, unspecified: Principal | ICD-10-CM

## 2019-03-25 DIAGNOSIS — Z6839 Body mass index (BMI) 39.0-39.9, adult: Principal | ICD-10-CM

## 2019-03-27 ENCOUNTER — Encounter: Admit: 2019-03-27 | Discharge: 2019-03-28 | Payer: PRIVATE HEALTH INSURANCE

## 2019-03-27 MED ORDER — LIRAGLUTIDE (WEIGHT LOSS) 3 MG/0.5 ML (18 MG/3 ML) SUBCUT PEN INJECTOR: 3 mg | mL | Freq: Every day | 1 refills | 30 days | Status: AC

## 2019-03-27 MED ORDER — PEN NEEDLE, DIABETIC 32 GAUGE X 5/32" (4 MM): 50 | each | Freq: Every day | 1 refills | 1 days | Status: AC

## 2019-05-21 DIAGNOSIS — Z6839 Body mass index (BMI) 39.0-39.9, adult: Principal | ICD-10-CM

## 2019-05-21 DIAGNOSIS — E669 Obesity, unspecified: Principal | ICD-10-CM

## 2019-05-21 MED ORDER — SAXENDA 3 MG/0.5 ML (18 MG/3 ML) SUBCUTANEOUS PEN INJECTOR
INJECTION | Freq: Every day | SUBCUTANEOUS | 1 refills | 0 days | Status: CP
Start: 2019-05-21 — End: ?

## 2019-05-25 DIAGNOSIS — Z1231 Encounter for screening mammogram for malignant neoplasm of breast: Principal | ICD-10-CM

## 2019-07-21 ENCOUNTER — Encounter: Admit: 2019-07-21 | Discharge: 2019-07-22 | Payer: PRIVATE HEALTH INSURANCE

## 2019-07-24 DIAGNOSIS — E669 Obesity, unspecified: Principal | ICD-10-CM

## 2019-07-24 DIAGNOSIS — Z6839 Body mass index (BMI) 39.0-39.9, adult: Principal | ICD-10-CM

## 2019-07-27 MED ORDER — SAXENDA 3 MG/0.5 ML (18 MG/3 ML) SUBCUTANEOUS PEN INJECTOR
Freq: Every day | SUBCUTANEOUS | 0 refills | 6 days | Status: CP
Start: 2019-07-27 — End: ?

## 2019-07-28 ENCOUNTER — Encounter
Admit: 2019-07-28 | Discharge: 2019-07-28 | Disposition: A | Discharge status: Home / Self Care | Payer: PRIVATE HEALTH INSURANCE | Attending: Family

## 2019-07-28 ENCOUNTER — Emergency Department
Admit: 2019-07-28 | Discharge: 2019-07-28 | Disposition: A | Discharge status: Home / Self Care | Payer: PRIVATE HEALTH INSURANCE | Attending: Family

## 2019-07-28 DIAGNOSIS — M549 Dorsalgia, unspecified: Principal | ICD-10-CM

## 2019-07-28 DIAGNOSIS — R0789 Other chest pain: Principal | ICD-10-CM

## 2019-07-28 DIAGNOSIS — S8012XA Contusion of left lower leg, initial encounter: Principal | ICD-10-CM

## 2019-07-31 ENCOUNTER — Other Ambulatory Visit: Payer: Self-pay

## 2019-07-31 ENCOUNTER — Other Ambulatory Visit: Payer: Self-pay | Admitting: Chiropractor

## 2019-07-31 ENCOUNTER — Emergency Department: Payer: No Typology Code available for payment source

## 2019-07-31 ENCOUNTER — Ambulatory Visit
Admission: RE | Admit: 2019-07-31 | Discharge: 2019-07-31 | Disposition: A | Discharge status: Home / Self Care | Payer: No Typology Code available for payment source | Source: Ambulatory Visit | Attending: Chiropractor | Admitting: Chiropractor

## 2019-07-31 ENCOUNTER — Emergency Department
Admission: EM | Admit: 2019-07-31 | Discharge: 2019-07-31 | Disposition: A | Discharge status: Home / Self Care | Payer: No Typology Code available for payment source | Attending: Student | Admitting: Student

## 2019-07-31 ENCOUNTER — Encounter: Admit: 2019-07-31 | Discharge: 2019-08-01 | Payer: PRIVATE HEALTH INSURANCE

## 2019-07-31 DIAGNOSIS — Y998 Other external cause status: Secondary | ICD-10-CM | POA: Diagnosis not present

## 2019-07-31 DIAGNOSIS — M542 Cervicalgia: Secondary | ICD-10-CM

## 2019-07-31 DIAGNOSIS — I6200 Nontraumatic subdural hemorrhage, unspecified: Secondary | ICD-10-CM

## 2019-07-31 DIAGNOSIS — R9389 Abnormal findings on diagnostic imaging of other specified body structures: Secondary | ICD-10-CM | POA: Insufficient documentation

## 2019-07-31 DIAGNOSIS — Y92410 Unspecified street and highway as the place of occurrence of the external cause: Secondary | ICD-10-CM | POA: Insufficient documentation

## 2019-07-31 DIAGNOSIS — R519 Headache, unspecified: Secondary | ICD-10-CM | POA: Insufficient documentation

## 2019-07-31 DIAGNOSIS — Y9389 Activity, other specified: Secondary | ICD-10-CM | POA: Insufficient documentation

## 2019-07-31 HISTORY — DX: Migraine, unspecified, not intractable, without status migrainosus: G43.909

## 2019-07-31 LAB — CBC WITH DIFFERENTIAL/PLATELET
Abs Immature Granulocytes: 0.02 10*3/uL (ref 0.00–0.07)
Basophils Absolute: 0 10*3/uL (ref 0.0–0.1)
Basophils Relative: 0 %
Eosinophils Absolute: 0.3 10*3/uL (ref 0.0–0.5)
Eosinophils Relative: 3 %
HCT: 36.4 % (ref 36.0–46.0)
Hemoglobin: 11.9 g/dL — ABNORMAL LOW (ref 12.0–15.0)
Immature Granulocytes: 0 %
Lymphocytes Relative: 26 %
Lymphs Abs: 3 10*3/uL (ref 0.7–4.0)
MCH: 27.9 pg (ref 26.0–34.0)
MCHC: 32.7 g/dL (ref 30.0–36.0)
MCV: 85.2 fL (ref 80.0–100.0)
Monocytes Absolute: 0.9 10*3/uL (ref 0.1–1.0)
Monocytes Relative: 8 %
Neutro Abs: 7.2 10*3/uL (ref 1.7–7.7)
Neutrophils Relative %: 63 %
Platelets: 279 10*3/uL (ref 150–400)
RBC: 4.27 MIL/uL (ref 3.87–5.11)
RDW: 13.8 % (ref 11.5–15.5)
WBC: 11.4 10*3/uL — ABNORMAL HIGH (ref 4.0–10.5)
nRBC: 0 % (ref 0.0–0.2)

## 2019-07-31 LAB — BASIC METABOLIC PANEL
Anion gap: 7 (ref 5–15)
BUN: 12 mg/dL (ref 6–20)
CO2: 22 mmol/L (ref 22–32)
Calcium: 8.5 mg/dL — ABNORMAL LOW (ref 8.9–10.3)
Chloride: 109 mmol/L (ref 98–111)
Creatinine, Ser: 0.76 mg/dL (ref 0.44–1.00)
GFR calc Af Amer: >60 mL/min (ref >60)
GFR calc non Af Amer: >60 mL/min (ref >60)
Glucose, Bld: 93 mg/dL (ref 70–99)
Potassium: 3.7 mmol/L (ref 3.5–5.1)
Sodium: 138 mmol/L (ref 135–145)

## 2019-07-31 MED ORDER — GADOBUTROL 1 MMOL/ML IV SOLN
8.0000 mL | Freq: Once | INTRAVENOUS | Status: AC | PRN
  Administered 2019-07-31: 8 mL via INTRAVENOUS
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
## 2019-07-31 NOTE — ED Notes (Signed)
MD Scotty Court states no bloodwork or other orders at this time.

## 2019-07-31 NOTE — Discharge Instructions (Signed)
Thank you for letting us take care of you in the emergency department today.   Please continue to take any regular, prescribed medications.   Please follow up with: - Dr. Maurice Small, the neurosurgery doctor, as needed. Information below.   Please return to the ER for any new or worsening symptoms.

## 2019-07-31 NOTE — ED Notes (Signed)
Pt remains off unit at MRI

## 2019-07-31 NOTE — ED Notes (Signed)
Pt back to unit. Explained timeframe for MRI results. Pt agreeable. Bed locked low. Rail up. Pt currently denies any needs.
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
## 2019-08-14 ENCOUNTER — Encounter: Admit: 2019-08-14 | Discharge: 2019-08-15 | Payer: PRIVATE HEALTH INSURANCE

## 2019-08-14 MED ORDER — TRIAMCINOLONE ACETONIDE 0.5 % TOPICAL OINTMENT
Freq: Two times a day (BID) | TOPICAL | 0 refills | 14.00000 days | Status: CP
Start: 2019-08-14 — End: 2019-08-28

## 2019-08-17 MED ORDER — ERGOCALCIFEROL (VITAMIN D2) 1,250 MCG (50,000 UNIT) CAPSULE
ORAL_CAPSULE | Freq: Every day | ORAL | 2 refills | 30.00000 days | Status: CP
Start: 2019-08-17 — End: ?

## 2019-08-20 DIAGNOSIS — Z6839 Body mass index (BMI) 39.0-39.9, adult: Principal | ICD-10-CM

## 2019-08-20 DIAGNOSIS — E559 Vitamin D deficiency, unspecified: Principal | ICD-10-CM

## 2019-08-20 DIAGNOSIS — E669 Obesity, unspecified: Principal | ICD-10-CM

## 2019-08-28 ENCOUNTER — Encounter: Admit: 2019-08-28 | Discharge: 2019-08-29 | Payer: PRIVATE HEALTH INSURANCE

## 2019-09-08 ENCOUNTER — Encounter: Admit: 2019-09-08 | Discharge: 2019-09-09 | Payer: PRIVATE HEALTH INSURANCE

## 2019-09-08 DIAGNOSIS — E559 Vitamin D deficiency, unspecified: Principal | ICD-10-CM

## 2019-10-13 ENCOUNTER — Telehealth: Admit: 2019-10-13 | Discharge: 2019-10-14 | Payer: PRIVATE HEALTH INSURANCE

## 2019-10-28 ENCOUNTER — Encounter: Admit: 2019-10-28 | Discharge: 2019-10-29 | Payer: PRIVATE HEALTH INSURANCE

## 2019-11-03 ENCOUNTER — Ambulatory Visit: Admit: 2019-11-03 | Discharge: 2019-11-04 | Payer: PRIVATE HEALTH INSURANCE

## 2019-11-03 DIAGNOSIS — E6609 Other obesity due to excess calories: Principal | ICD-10-CM

## 2019-11-03 DIAGNOSIS — Z6839 Body mass index (BMI) 39.0-39.9, adult: Principal | ICD-10-CM

## 2019-11-03 DIAGNOSIS — Z9884 Bariatric surgery status: Principal | ICD-10-CM

## 2019-11-03 MED ORDER — METFORMIN ER 500 MG TABLET,EXTENDED RELEASE 24 HR
ORAL_TABLET | Freq: Every day | ORAL | 2 refills | 60.00000 days | Status: CP
Start: 2019-11-03 — End: ?

## 2019-11-11 DIAGNOSIS — L709 Acne, unspecified: Principal | ICD-10-CM

## 2019-11-20 ENCOUNTER — Ambulatory Visit: Admit: 2019-11-20 | Discharge: 2019-11-21 | Payer: PRIVATE HEALTH INSURANCE

## 2019-11-20 DIAGNOSIS — Z9884 Bariatric surgery status: Principal | ICD-10-CM

## 2019-11-20 DIAGNOSIS — R110 Nausea: Principal | ICD-10-CM

## 2019-12-04 ENCOUNTER — Ambulatory Visit: Admit: 2019-12-04 | Discharge: 2019-12-05 | Payer: PRIVATE HEALTH INSURANCE

## 2019-12-04 DIAGNOSIS — E559 Vitamin D deficiency, unspecified: Principal | ICD-10-CM

## 2019-12-09 DIAGNOSIS — R7989 Other specified abnormal findings of blood chemistry: Principal | ICD-10-CM

## 2019-12-10 ENCOUNTER — Ambulatory Visit: Admit: 2019-12-10 | Discharge: 2019-12-10 | Payer: PRIVATE HEALTH INSURANCE

## 2019-12-10 DIAGNOSIS — Z6839 Body mass index (BMI) 39.0-39.9, adult: Principal | ICD-10-CM

## 2019-12-10 DIAGNOSIS — R7989 Other specified abnormal findings of blood chemistry: Principal | ICD-10-CM

## 2019-12-10 DIAGNOSIS — E6609 Other obesity due to excess calories: Principal | ICD-10-CM

## 2019-12-10 DIAGNOSIS — Z9884 Bariatric surgery status: Principal | ICD-10-CM

## 2019-12-10 MED ORDER — METFORMIN ER 500 MG TABLET,EXTENDED RELEASE 24 HR
ORAL_TABLET | Freq: Every day | ORAL | 2 refills | 30.00000 days | Status: CP
Start: 2019-12-10 — End: ?

## 2019-12-10 MED ORDER — WEGOVY 0.25 MG/0.5 ML SUBCUTANEOUS PEN INJECTOR
SUBCUTANEOUS | 1 refills | 0.00000 days | Status: CP
Start: 2019-12-10 — End: ?

## 2019-12-14 DIAGNOSIS — Z6839 Body mass index (BMI) 39.0-39.9, adult: Principal | ICD-10-CM

## 2019-12-14 DIAGNOSIS — E6609 Other obesity due to excess calories: Principal | ICD-10-CM

## 2019-12-14 MED ORDER — OZEMPIC 0.25 MG OR 0.5 MG (2 MG/1.5 ML) SUBCUTANEOUS PEN INJECTOR
SUBCUTANEOUS | 1 refills | 112.00000 days | Status: CP
Start: 2019-12-14 — End: ?
  Filled 2019-12-16: qty 1.5, 56d supply, fill #0

## 2019-12-16 MED FILL — OZEMPIC 0.25 MG OR 0.5 MG (2 MG/1.5 ML) SUBCUTANEOUS PEN INJECTOR: 56 days supply | Qty: 2 | Fill #0 | Status: AC

## 2019-12-30 ENCOUNTER — Ambulatory Visit
Admit: 2019-12-30 | Discharge: 2019-12-30 | Payer: PRIVATE HEALTH INSURANCE | Attending: Student in an Organized Health Care Education/Training Program | Primary: Student in an Organized Health Care Education/Training Program

## 2019-12-30 ENCOUNTER — Ambulatory Visit: Admit: 2019-12-30 | Discharge: 2019-12-30 | Payer: PRIVATE HEALTH INSURANCE

## 2019-12-30 DIAGNOSIS — R42 Dizziness and giddiness: Principal | ICD-10-CM

## 2019-12-30 DIAGNOSIS — I10 Essential (primary) hypertension: Principal | ICD-10-CM

## 2020-01-06 MED ORDER — WEGOVY 0.5 MG/0.5 ML SUBCUTANEOUS PEN INJECTOR
SUBCUTANEOUS | 1 refills | 0.00000 days | Status: CP
Start: 2020-01-06 — End: ?

## 2020-01-07 DIAGNOSIS — E6609 Other obesity due to excess calories: Principal | ICD-10-CM

## 2020-01-07 DIAGNOSIS — Z6839 Body mass index (BMI) 39.0-39.9, adult: Principal | ICD-10-CM

## 2020-01-07 DIAGNOSIS — Z9884 Bariatric surgery status: Principal | ICD-10-CM

## 2020-01-08 MED ORDER — ALBUTEROL SULFATE HFA 90 MCG/ACTUATION AEROSOL INHALER
Freq: Four times a day (QID) | RESPIRATORY_TRACT | 11 refills | 0.00000 days | Status: CP | PRN
Start: 2020-01-08 — End: 2021-01-07

## 2020-01-08 MED ORDER — METAXALONE 800 MG TABLET
ORAL_TABLET | Freq: Three times a day (TID) | ORAL | 0 refills | 10 days | Status: CP | PRN
Start: 2020-01-08 — End: ?

## 2020-01-08 MED ORDER — METFORMIN ER 500 MG TABLET,EXTENDED RELEASE 24 HR
ORAL_TABLET | 2 refills | 0.00000 days | Status: CP
Start: 2020-01-08 — End: ?

## 2020-01-08 MED ORDER — MONTELUKAST 10 MG TABLET
ORAL_TABLET | Freq: Every evening | ORAL | 3 refills | 90.00000 days | Status: CP
Start: 2020-01-08 — End: 2020-02-07

## 2020-01-08 MED ORDER — NORETHINDRONE ACETATE 5 MG TABLET
ORAL_TABLET | Freq: Every day | ORAL | 3 refills | 90 days | Status: CP
Start: 2020-01-08 — End: ?

## 2020-01-08 MED ORDER — VALACYCLOVIR 1 GRAM TABLET
ORAL_TABLET | Freq: Every day | ORAL | 3 refills | 30.00000 days | Status: CP
Start: 2020-01-08 — End: 2020-01-13

## 2020-01-08 MED ORDER — FLUTICASONE PROPIONATE 50 MCG/ACTUATION NASAL SPRAY,SUSPENSION
Freq: Every day | NASAL | 11 refills | 0 days | Status: CP
Start: 2020-01-08 — End: 2021-11-24

## 2020-01-08 MED ORDER — SUMATRIPTAN 50 MG TABLET
ORAL_TABLET | 3 refills | 0.00000 days | Status: CP
Start: 2020-01-08 — End: ?

## 2020-01-18 ENCOUNTER — Ambulatory Visit: Admit: 2020-01-18 | Discharge: 2020-01-19 | Payer: PRIVATE HEALTH INSURANCE

## 2020-01-18 MED ORDER — RIZATRIPTAN 10 MG TABLET
ORAL_TABLET | Freq: Once | ORAL | 3 refills | 0.00000 days | Status: CP | PRN
Start: 2020-01-18 — End: 2021-01-18

## 2020-01-18 MED ORDER — TOPIRAMATE 25 MG TABLET
ORAL_TABLET | Freq: Every evening | ORAL | 2 refills | 30.00000 days | Status: CP
Start: 2020-01-18 — End: 2021-01-17

## 2020-01-22 ENCOUNTER — Ambulatory Visit: Admit: 2020-01-22 | Discharge: 2020-01-23 | Payer: PRIVATE HEALTH INSURANCE

## 2020-01-28 ENCOUNTER — Ambulatory Visit: Admit: 2020-01-28 | Discharge: 2020-01-29 | Payer: PRIVATE HEALTH INSURANCE

## 2020-01-28 DIAGNOSIS — E559 Vitamin D deficiency, unspecified: Principal | ICD-10-CM

## 2020-02-05 ENCOUNTER — Telehealth: Admit: 2020-02-05 | Discharge: 2020-02-06 | Payer: PRIVATE HEALTH INSURANCE

## 2020-02-05 MED ORDER — TOPIRAMATE 25 MG TABLET
ORAL_TABLET | Freq: Every evening | ORAL | 2 refills | 30 days | Status: CP
Start: 2020-02-05 — End: 2021-02-04

## 2020-02-09 MED ORDER — TOPIRAMATE 25 MG TABLET
ORAL_TABLET | Freq: Every evening | ORAL | 2 refills | 30 days | Status: CP
Start: 2020-02-09 — End: 2020-02-09

## 2020-02-09 MED ORDER — TOPIRAMATE 50 MG TABLET
ORAL_TABLET | Freq: Every evening | ORAL | 2 refills | 30.00000 days | Status: CP
Start: 2020-02-09 — End: 2021-02-08
  Filled 2020-02-12: qty 30, 30d supply, fill #0

## 2020-02-12 MED ORDER — NORETHINDRONE ACETATE 5 MG TABLET
ORAL_TABLET | Freq: Every day | ORAL | 11 refills | 30 days | Status: CP
Start: 2020-02-12 — End: ?
  Filled 2020-02-16: qty 60, 30d supply, fill #0

## 2020-02-12 MED FILL — TOPIRAMATE 50 MG TABLET: 30 days supply | Qty: 30 | Fill #0 | Status: AC

## 2020-02-16 MED FILL — NORETHINDRONE ACETATE 5 MG TABLET: 30 days supply | Qty: 60 | Fill #0 | Status: AC

## 2020-02-25 DIAGNOSIS — E6609 Other obesity due to excess calories: Principal | ICD-10-CM

## 2020-02-25 DIAGNOSIS — Z6839 Body mass index (BMI) 39.0-39.9, adult: Principal | ICD-10-CM

## 2020-02-25 DIAGNOSIS — Z9884 Bariatric surgery status: Principal | ICD-10-CM

## 2020-02-25 MED ORDER — OZEMPIC 1 MG/DOSE (4 MG/3 ML) SUBCUTANEOUS PEN INJECTOR
SUBCUTANEOUS | 2 refills | 28.00000 days | Status: CP
Start: 2020-02-25 — End: ?
  Filled 2020-02-26: qty 3, 28d supply, fill #0

## 2020-02-26 MED FILL — OZEMPIC 1 MG/DOSE (4 MG/3 ML) SUBCUTANEOUS PEN INJECTOR: 28 days supply | Qty: 3 | Fill #0 | Status: AC

## 2020-03-04 ENCOUNTER — Ambulatory Visit: Admit: 2020-03-04 | Discharge: 2020-03-05 | Payer: PRIVATE HEALTH INSURANCE

## 2020-03-04 DIAGNOSIS — N946 Dysmenorrhea, unspecified: Principal | ICD-10-CM

## 2020-03-04 DIAGNOSIS — N939 Abnormal uterine and vaginal bleeding, unspecified: Principal | ICD-10-CM

## 2020-03-04 MED ORDER — NORETHINDRONE ACETATE 5 MG TABLET
ORAL_TABLET | Freq: Every day | ORAL | 3 refills | 60.00000 days | Status: CP
Start: 2020-03-04 — End: 2020-06-02

## 2020-03-17 ENCOUNTER — Ambulatory Visit: Admit: 2020-03-17 | Discharge: 2020-03-18 | Payer: PRIVATE HEALTH INSURANCE

## 2020-03-17 DIAGNOSIS — E6609 Other obesity due to excess calories: Principal | ICD-10-CM

## 2020-03-17 DIAGNOSIS — Z6839 Body mass index (BMI) 39.0-39.9, adult: Principal | ICD-10-CM

## 2020-03-17 DIAGNOSIS — Z9884 Bariatric surgery status: Principal | ICD-10-CM

## 2020-03-17 MED ORDER — OZEMPIC 1 MG/DOSE (4 MG/3 ML) SUBCUTANEOUS PEN INJECTOR
SUBCUTANEOUS | 2 refills | 28 days | Status: CP
Start: 2020-03-17 — End: ?
  Filled 2020-03-17: qty 3, 28d supply, fill #0

## 2020-03-17 MED ORDER — METFORMIN ER 500 MG TABLET,EXTENDED RELEASE 24 HR
ORAL_TABLET | Freq: Every day | ORAL | 2 refills | 30.00000 days | Status: CP
Start: 2020-03-17 — End: ?
  Filled 2020-03-17: qty 60, 30d supply, fill #0

## 2020-03-17 MED FILL — OZEMPIC 1 MG/DOSE (4 MG/3 ML) SUBCUTANEOUS PEN INJECTOR: 28 days supply | Qty: 3 | Fill #0 | Status: AC

## 2020-03-17 MED FILL — METFORMIN ER 500 MG TABLET,EXTENDED RELEASE 24 HR: 30 days supply | Qty: 60 | Fill #0 | Status: AC

## 2020-03-30 DIAGNOSIS — Z9884 Bariatric surgery status: Principal | ICD-10-CM

## 2020-03-30 DIAGNOSIS — Z6839 Body mass index (BMI) 39.0-39.9, adult: Principal | ICD-10-CM

## 2020-03-30 DIAGNOSIS — E6609 Other obesity due to excess calories: Principal | ICD-10-CM

## 2020-03-31 MED ORDER — METFORMIN ER 500 MG TABLET,EXTENDED RELEASE 24 HR
ORAL_TABLET | Freq: Every day | ORAL | 1 refills | 90.00000 days | Status: CP
Start: 2020-03-31 — End: ?

## 2020-04-03 DIAGNOSIS — N939 Abnormal uterine and vaginal bleeding, unspecified: Principal | ICD-10-CM

## 2020-04-03 DIAGNOSIS — N946 Dysmenorrhea, unspecified: Principal | ICD-10-CM

## 2020-04-05 MED FILL — TOPIRAMATE 50 MG TABLET: 30 days supply | Qty: 30 | Fill #1 | Status: AC

## 2020-04-05 MED FILL — TOPIRAMATE 50 MG TABLET: ORAL | 30 days supply | Qty: 30 | Fill #1

## 2020-04-06 MED ORDER — NORETHINDRONE ACETATE 5 MG TABLET
ORAL_TABLET | Freq: Every day | ORAL | 3 refills | 60.00000 days | Status: CP
Start: 2020-04-06 — End: 2020-08-15
  Filled 2020-06-16: qty 30, 60d supply, fill #0

## 2020-04-07 DIAGNOSIS — Z6839 Body mass index (BMI) 39.0-39.9, adult: Principal | ICD-10-CM

## 2020-04-07 DIAGNOSIS — E6609 Other obesity due to excess calories: Principal | ICD-10-CM

## 2020-04-08 MED ORDER — WEGOVY 1.7 MG/0.75 ML SUBCUTANEOUS PEN INJECTOR
SUBCUTANEOUS | 1 refills | 0 days | Status: CP
Start: 2020-04-08 — End: 2020-06-16

## 2020-04-08 MED ORDER — WEGOVY 2.4 MG/0.75 ML SUBCUTANEOUS PEN INJECTOR
SUBCUTANEOUS | 3 refills | 0 days | Status: CP
Start: 2020-04-08 — End: 2020-05-31

## 2020-04-13 ENCOUNTER — Encounter: Admit: 2020-04-13 | Discharge: 2020-04-14 | Payer: PRIVATE HEALTH INSURANCE

## 2020-04-13 DIAGNOSIS — Z0000 Encounter for general adult medical examination without abnormal findings: Principal | ICD-10-CM

## 2020-04-13 DIAGNOSIS — G8929 Other chronic pain: Principal | ICD-10-CM

## 2020-04-13 DIAGNOSIS — M62830 Muscle spasm of back: Principal | ICD-10-CM

## 2020-04-13 DIAGNOSIS — M5441 Lumbago with sciatica, right side: Principal | ICD-10-CM

## 2020-04-14 ENCOUNTER — Encounter: Admit: 2020-04-14 | Discharge: 2020-04-15 | Payer: PRIVATE HEALTH INSURANCE

## 2020-04-14 DIAGNOSIS — M62830 Muscle spasm of back: Principal | ICD-10-CM

## 2020-05-06 DIAGNOSIS — Z9884 Bariatric surgery status: Principal | ICD-10-CM

## 2020-05-06 DIAGNOSIS — E6609 Other obesity due to excess calories: Principal | ICD-10-CM

## 2020-05-06 DIAGNOSIS — Z6839 Body mass index (BMI) 39.0-39.9, adult: Principal | ICD-10-CM

## 2020-05-06 MED ORDER — METFORMIN ER 500 MG TABLET,EXTENDED RELEASE 24 HR
ORAL_TABLET | Freq: Every day | ORAL | 1 refills | 90.00000 days | Status: CP
Start: 2020-05-06 — End: ?
  Filled 2020-06-16: qty 180, 90d supply, fill #0

## 2020-05-06 MED FILL — TOPIRAMATE 50 MG TABLET: 30 days supply | Qty: 30 | Fill #2 | Status: AC

## 2020-05-06 MED FILL — TOPIRAMATE 50 MG TABLET: ORAL | 30 days supply | Qty: 30 | Fill #2

## 2020-05-11 ENCOUNTER — Encounter: Admit: 2020-05-11 | Discharge: 2020-05-12 | Payer: PRIVATE HEALTH INSURANCE

## 2020-05-11 DIAGNOSIS — G8929 Other chronic pain: Principal | ICD-10-CM

## 2020-05-11 DIAGNOSIS — M47816 Spondylosis without myelopathy or radiculopathy, lumbar region: Principal | ICD-10-CM

## 2020-05-11 DIAGNOSIS — M7138 Other bursal cyst, other site: Principal | ICD-10-CM

## 2020-05-11 DIAGNOSIS — M5441 Lumbago with sciatica, right side: Principal | ICD-10-CM

## 2020-05-31 DIAGNOSIS — Z6839 Body mass index (BMI) 39.0-39.9, adult: Principal | ICD-10-CM

## 2020-05-31 DIAGNOSIS — E6609 Other obesity due to excess calories: Principal | ICD-10-CM

## 2020-05-31 MED ORDER — WEGOVY 2.4 MG/0.75 ML SUBCUTANEOUS PEN INJECTOR
SUBCUTANEOUS | 3 refills | 0.00000 days | Status: CP
Start: 2020-05-31 — End: 2020-06-16

## 2020-06-01 ENCOUNTER — Ambulatory Visit
Admit: 2020-06-01 | Discharge: 2020-06-02 | Payer: PRIVATE HEALTH INSURANCE | Attending: Physical Medicine & Rehabilitation | Primary: Physical Medicine & Rehabilitation

## 2020-06-01 DIAGNOSIS — G8929 Other chronic pain: Principal | ICD-10-CM

## 2020-06-01 DIAGNOSIS — M5417 Radiculopathy, lumbosacral region: Principal | ICD-10-CM

## 2020-06-01 DIAGNOSIS — M5441 Lumbago with sciatica, right side: Principal | ICD-10-CM

## 2020-06-01 DIAGNOSIS — M4727 Other spondylosis with radiculopathy, lumbosacral region: Principal | ICD-10-CM

## 2020-06-01 MED ORDER — METHYLPREDNISOLONE 4 MG TABLETS IN A DOSE PACK
0 refills | 0 days | Status: CP
Start: 2020-06-01 — End: 2020-06-14

## 2020-06-14 ENCOUNTER — Ambulatory Visit: Admit: 2020-06-14 | Discharge: 2020-06-14 | Attending: Physician Assistant | Primary: Physician Assistant

## 2020-06-14 ENCOUNTER — Institutional Professional Consult (permissible substitution): Admit: 2020-06-14 | Discharge: 2020-06-14

## 2020-06-14 DIAGNOSIS — Z20822 Contact with and (suspected) exposure to covid-19: Principal | ICD-10-CM

## 2020-06-14 DIAGNOSIS — Z20828 Contact with and (suspected) exposure to other viral communicable diseases: Principal | ICD-10-CM

## 2020-06-14 DIAGNOSIS — R197 Diarrhea, unspecified: Principal | ICD-10-CM

## 2020-06-14 MED ORDER — ONDANSETRON HCL 4 MG TABLET
ORAL_TABLET | Freq: Three times a day (TID) | ORAL | 0 refills | 10 days | Status: CP | PRN
Start: 2020-06-14 — End: 2021-06-14

## 2020-06-16 ENCOUNTER — Ambulatory Visit: Admit: 2020-06-16 | Discharge: 2020-06-17

## 2020-06-16 DIAGNOSIS — E6609 Other obesity due to excess calories: Principal | ICD-10-CM

## 2020-06-16 DIAGNOSIS — Z6835 Body mass index (BMI) 35.0-35.9, adult: Principal | ICD-10-CM

## 2020-06-16 DIAGNOSIS — Z9884 Bariatric surgery status: Principal | ICD-10-CM

## 2020-06-16 DIAGNOSIS — G43009 Migraine without aura, not intractable, without status migrainosus: Principal | ICD-10-CM

## 2020-06-16 DIAGNOSIS — Z6839 Body mass index (BMI) 39.0-39.9, adult: Principal | ICD-10-CM

## 2020-06-16 MED ORDER — WEGOVY 2.4 MG/0.75 ML SUBCUTANEOUS PEN INJECTOR
SUBCUTANEOUS | 4 refills | 0 days | Status: CP
Start: 2020-06-16 — End: ?

## 2020-06-16 MED ORDER — TOPIRAMATE 50 MG TABLET
ORAL_TABLET | Freq: Every evening | ORAL | 2 refills | 30.00000 days | Status: CP
Start: 2020-06-16 — End: 2021-06-16

## 2020-06-21 MED ORDER — TOPIRAMATE 50 MG TABLET
ORAL_TABLET | Freq: Every evening | ORAL | 2 refills | 30 days | Status: CP
Start: 2020-06-21 — End: 2021-06-21

## 2020-07-13 ENCOUNTER — Ambulatory Visit: Admit: 2020-07-13 | Discharge: 2020-07-14

## 2020-07-13 DIAGNOSIS — M4727 Other spondylosis with radiculopathy, lumbosacral region: Principal | ICD-10-CM

## 2020-07-13 DIAGNOSIS — Z6835 Body mass index (BMI) 35.0-35.9, adult: Principal | ICD-10-CM

## 2020-07-20 DIAGNOSIS — Z1231 Encounter for screening mammogram for malignant neoplasm of breast: Principal | ICD-10-CM

## 2020-07-27 DIAGNOSIS — S01512A Laceration without foreign body of oral cavity, initial encounter: Principal | ICD-10-CM

## 2020-07-28 ENCOUNTER — Encounter
Admit: 2020-07-28 | Discharge: 2020-07-28 | Disposition: A | Discharge status: Home / Self Care | Payer: PRIVATE HEALTH INSURANCE | Attending: Emergency Medicine

## 2020-08-01 DIAGNOSIS — E6609 Other obesity due to excess calories: Principal | ICD-10-CM

## 2020-08-01 DIAGNOSIS — Z6839 Body mass index (BMI) 39.0-39.9, adult: Principal | ICD-10-CM

## 2020-08-03 DIAGNOSIS — M4727 Other spondylosis with radiculopathy, lumbosacral region: Principal | ICD-10-CM

## 2020-08-03 MED ORDER — WEGOVY 2.4 MG/0.75 ML SUBCUTANEOUS PEN INJECTOR
SUBCUTANEOUS | 4 refills | 0.00000 days | Status: CP
Start: 2020-08-03 — End: ?

## 2020-08-09 MED FILL — NORETHINDRONE ACETATE 5 MG TABLET: ORAL | 60 days supply | Qty: 30 | Fill #0

## 2020-08-12 ENCOUNTER — Ambulatory Visit: Admit: 2020-08-12 | Discharge: 2020-08-12 | Payer: PRIVATE HEALTH INSURANCE

## 2020-08-12 ENCOUNTER — Ambulatory Visit
Admit: 2020-08-12 | Discharge: 2020-08-12 | Payer: PRIVATE HEALTH INSURANCE | Attending: Physical Medicine & Rehabilitation | Primary: Physical Medicine & Rehabilitation

## 2020-08-30 DIAGNOSIS — E6609 Other obesity due to excess calories: Principal | ICD-10-CM

## 2020-08-30 DIAGNOSIS — Z6839 Body mass index (BMI) 39.0-39.9, adult: Principal | ICD-10-CM

## 2020-09-07 ENCOUNTER — Ambulatory Visit: Admit: 2020-09-07 | Discharge: 2020-09-08 | Payer: PRIVATE HEALTH INSURANCE

## 2020-09-07 DIAGNOSIS — M5417 Radiculopathy, lumbosacral region: Principal | ICD-10-CM

## 2020-09-07 MED ORDER — GABAPENTIN 100 MG CAPSULE
ORAL_CAPSULE | ORAL | 0 refills | 30 days | Status: CP
Start: 2020-09-07 — End: 2020-10-07
  Filled 2020-09-07: qty 195, 30d supply, fill #0

## 2020-09-16 ENCOUNTER — Ambulatory Visit
Admit: 2020-09-16 | Discharge: 2020-10-15 | Payer: PRIVATE HEALTH INSURANCE | Attending: Rehabilitative and Restorative Service Providers" | Primary: Rehabilitative and Restorative Service Providers"

## 2020-09-26 DIAGNOSIS — Z6839 Body mass index (BMI) 39.0-39.9, adult: Principal | ICD-10-CM

## 2020-09-26 DIAGNOSIS — E6609 Other obesity due to excess calories: Principal | ICD-10-CM

## 2020-09-29 DIAGNOSIS — E6609 Other obesity due to excess calories: Principal | ICD-10-CM

## 2020-09-29 DIAGNOSIS — Z6839 Body mass index (BMI) 39.0-39.9, adult: Principal | ICD-10-CM

## 2020-09-29 MED ORDER — WEGOVY 2.4 MG/0.75 ML SUBCUTANEOUS PEN INJECTOR
SUBCUTANEOUS | 4 refills | 0 days | Status: CP
Start: 2020-09-29 — End: ?

## 2020-10-28 ENCOUNTER — Ambulatory Visit: Admit: 2020-10-28 | Discharge: 2020-10-28 | Payer: PRIVATE HEALTH INSURANCE

## 2020-11-02 ENCOUNTER — Ambulatory Visit
Admit: 2020-11-02 | Payer: PRIVATE HEALTH INSURANCE | Attending: Rehabilitative and Restorative Service Providers" | Primary: Rehabilitative and Restorative Service Providers"

## 2020-11-03 ENCOUNTER — Ambulatory Visit: Admit: 2020-11-03 | Discharge: 2020-11-04 | Payer: PRIVATE HEALTH INSURANCE

## 2020-11-03 ENCOUNTER — Ambulatory Visit
Admit: 2020-11-03 | Discharge: 2020-11-04 | Payer: PRIVATE HEALTH INSURANCE | Attending: Physical Medicine & Rehabilitation | Primary: Physical Medicine & Rehabilitation

## 2020-11-03 DIAGNOSIS — E6609 Other obesity due to excess calories: Principal | ICD-10-CM

## 2020-11-03 DIAGNOSIS — Z6839 Body mass index (BMI) 39.0-39.9, adult: Principal | ICD-10-CM

## 2020-11-03 DIAGNOSIS — Z9884 Bariatric surgery status: Principal | ICD-10-CM

## 2020-11-03 MED ORDER — WEGOVY 2.4 MG/0.75 ML SUBCUTANEOUS PEN INJECTOR
SUBCUTANEOUS | 6 refills | 0.00000 days | Status: CP
Start: 2020-11-03 — End: ?

## 2020-11-09 ENCOUNTER — Ambulatory Visit
Admit: 2020-11-09 | Payer: PRIVATE HEALTH INSURANCE | Attending: Rehabilitative and Restorative Service Providers" | Primary: Rehabilitative and Restorative Service Providers"

## 2020-11-16 MED ORDER — TOPIRAMATE 50 MG TABLET
ORAL_TABLET | Freq: Every evening | ORAL | 2 refills | 30 days | Status: CP
Start: 2020-11-16 — End: 2021-11-16
  Filled 2020-11-21: qty 30, 30d supply, fill #0

## 2020-11-16 MED FILL — NORETHINDRONE ACETATE 5 MG TABLET: ORAL | 60 days supply | Qty: 30 | Fill #1

## 2020-12-26 DIAGNOSIS — Z6839 Body mass index (BMI) 39.0-39.9, adult: Principal | ICD-10-CM

## 2020-12-26 DIAGNOSIS — E6609 Other obesity due to excess calories: Principal | ICD-10-CM

## 2020-12-26 DIAGNOSIS — Z9884 Bariatric surgery status: Principal | ICD-10-CM

## 2020-12-27 MED ORDER — MONTELUKAST 10 MG TABLET
ORAL_TABLET | Freq: Every evening | ORAL | 3 refills | 90.00000 days | Status: CP
Start: 2020-12-27 — End: 2021-01-26

## 2020-12-27 MED ORDER — ALBUTEROL SULFATE HFA 90 MCG/ACTUATION AEROSOL INHALER
Freq: Four times a day (QID) | RESPIRATORY_TRACT | 11 refills | 0 days | Status: CP | PRN
Start: 2020-12-27 — End: 2021-12-27

## 2020-12-27 MED ORDER — FLUTICASONE PROPIONATE 50 MCG/ACTUATION NASAL SPRAY,SUSPENSION
Freq: Every day | NASAL | 11 refills | 0.00000 days | Status: CP
Start: 2020-12-27 — End: 2022-11-13

## 2021-01-09 DIAGNOSIS — M5416 Radiculopathy, lumbar region: Principal | ICD-10-CM

## 2021-02-13 ENCOUNTER — Ambulatory Visit: Admit: 2021-02-13 | Discharge: 2021-02-14 | Payer: PRIVATE HEALTH INSURANCE

## 2021-02-16 ENCOUNTER — Ambulatory Visit: Admit: 2021-02-16 | Discharge: 2021-02-17 | Payer: PRIVATE HEALTH INSURANCE

## 2021-03-31 ENCOUNTER — Ambulatory Visit: Admit: 2021-03-31 | Discharge: 2021-04-01 | Payer: PRIVATE HEALTH INSURANCE

## 2021-03-31 DIAGNOSIS — L304 Erythema intertrigo: Principal | ICD-10-CM

## 2021-03-31 MED ORDER — CLOTRIMAZOLE-BETAMETHASONE 1 %-0.05 % TOPICAL CREAM
Freq: Two times a day (BID) | TOPICAL | 0 refills | 0 days | Status: CP
Start: 2021-03-31 — End: 2022-03-31
  Filled 2021-04-01: qty 30, 15d supply, fill #0

## 2021-04-14 DIAGNOSIS — N939 Abnormal uterine and vaginal bleeding, unspecified: Principal | ICD-10-CM

## 2021-04-14 MED ORDER — NORETHINDRONE ACETATE 5 MG TABLET
ORAL_TABLET | Freq: Every day | ORAL | 3 refills | 90 days | Status: CP
Start: 2021-04-14 — End: 2021-07-13
  Filled 2021-04-14: qty 90, 90d supply, fill #0

## 2021-04-19 MED FILL — TOPIRAMATE 50 MG TABLET: ORAL | 30 days supply | Qty: 30 | Fill #1

## 2021-05-03 ENCOUNTER — Ambulatory Visit: Admit: 2021-05-03 | Discharge: 2021-05-03 | Payer: PRIVATE HEALTH INSURANCE

## 2021-05-03 DIAGNOSIS — G8929 Other chronic pain: Principal | ICD-10-CM

## 2021-05-03 DIAGNOSIS — M5441 Lumbago with sciatica, right side: Principal | ICD-10-CM

## 2021-05-10 ENCOUNTER — Ambulatory Visit
Admit: 2021-05-10 | Discharge: 2021-05-11 | Payer: PRIVATE HEALTH INSURANCE | Attending: Physical Medicine & Rehabilitation | Primary: Physical Medicine & Rehabilitation

## 2021-05-10 DIAGNOSIS — G8929 Other chronic pain: Principal | ICD-10-CM

## 2021-05-10 DIAGNOSIS — M533 Sacrococcygeal disorders, not elsewhere classified: Principal | ICD-10-CM

## 2021-05-10 MED ORDER — PREGABALIN 50 MG CAPSULE
ORAL_CAPSULE | Freq: Two times a day (BID) | ORAL | 3 refills | 30 days | Status: CP
Start: 2021-05-10 — End: 2022-05-10
  Filled 2021-05-10: qty 60, 30d supply, fill #0

## 2021-05-11 ENCOUNTER — Ambulatory Visit: Admit: 2021-05-11 | Discharge: 2021-05-12 | Payer: PRIVATE HEALTH INSURANCE

## 2021-05-11 ENCOUNTER — Telehealth: Admit: 2021-05-11 | Discharge: 2021-05-12 | Payer: PRIVATE HEALTH INSURANCE

## 2021-05-11 DIAGNOSIS — Z6839 Body mass index (BMI) 39.0-39.9, adult: Principal | ICD-10-CM

## 2021-05-11 DIAGNOSIS — Z9884 Bariatric surgery status: Principal | ICD-10-CM

## 2021-05-11 DIAGNOSIS — E6609 Other obesity due to excess calories: Principal | ICD-10-CM

## 2021-05-11 MED ORDER — WEGOVY 2.4 MG/0.75 ML SUBCUTANEOUS PEN INJECTOR
SUBCUTANEOUS | 6 refills | 0 days | Status: CP
Start: 2021-05-11 — End: ?

## 2021-06-09 ENCOUNTER — Ambulatory Visit
Admit: 2021-06-09 | Discharge: 2021-06-10 | Payer: PRIVATE HEALTH INSURANCE | Attending: Physical Medicine & Rehabilitation | Primary: Physical Medicine & Rehabilitation

## 2021-06-09 DIAGNOSIS — M533 Sacrococcygeal disorders, not elsewhere classified: Principal | ICD-10-CM

## 2021-06-09 DIAGNOSIS — G8929 Other chronic pain: Principal | ICD-10-CM

## 2021-06-22 ENCOUNTER — Ambulatory Visit
Admit: 2021-06-22 | Payer: PRIVATE HEALTH INSURANCE | Attending: Rehabilitative and Restorative Service Providers" | Primary: Rehabilitative and Restorative Service Providers"

## 2021-06-22 DIAGNOSIS — G4719 Other hypersomnia: Principal | ICD-10-CM

## 2021-07-04 ENCOUNTER — Institutional Professional Consult (permissible substitution): Admit: 2021-07-04 | Discharge: 2021-07-04 | Payer: PRIVATE HEALTH INSURANCE

## 2021-07-04 ENCOUNTER — Ambulatory Visit: Admit: 2021-07-04 | Discharge: 2021-07-04 | Payer: PRIVATE HEALTH INSURANCE

## 2021-07-04 DIAGNOSIS — Z20822 Contact with and (suspected) exposure to covid-19: Principal | ICD-10-CM

## 2021-07-14 ENCOUNTER — Ambulatory Visit: Admit: 2021-07-14 | Discharge: 2021-07-14 | Payer: PRIVATE HEALTH INSURANCE

## 2021-07-14 DIAGNOSIS — N920 Excessive and frequent menstruation with regular cycle: Principal | ICD-10-CM

## 2021-07-14 DIAGNOSIS — N939 Abnormal uterine and vaginal bleeding, unspecified: Principal | ICD-10-CM

## 2021-07-14 MED ORDER — TRANEXAMIC ACID 650 MG TABLET
ORAL_TABLET | Freq: Three times a day (TID) | ORAL | 1 refills | 5.00000 days | Status: CP
Start: 2021-07-14 — End: 2021-07-19

## 2021-07-21 MED FILL — NORETHINDRONE ACETATE 5 MG TABLET: ORAL | 90 days supply | Qty: 90 | Fill #1

## 2021-08-09 ENCOUNTER — Ambulatory Visit
Admit: 2021-08-09 | Discharge: 2021-08-10 | Payer: PRIVATE HEALTH INSURANCE | Attending: Physical Medicine & Rehabilitation | Primary: Physical Medicine & Rehabilitation

## 2021-08-09 DIAGNOSIS — Z1231 Encounter for screening mammogram for malignant neoplasm of breast: Principal | ICD-10-CM

## 2021-08-10 ENCOUNTER — Ambulatory Visit: Admit: 2021-08-10 | Discharge: 2021-08-11 | Payer: PRIVATE HEALTH INSURANCE

## 2021-08-31 MED FILL — TOPIRAMATE 50 MG TABLET: ORAL | 30 days supply | Qty: 30 | Fill #2

## 2021-09-04 ENCOUNTER — Telehealth: Admit: 2021-09-04 | Discharge: 2021-09-05 | Payer: PRIVATE HEALTH INSURANCE

## 2021-09-04 DIAGNOSIS — N939 Abnormal uterine and vaginal bleeding, unspecified: Principal | ICD-10-CM

## 2021-09-07 ENCOUNTER — Ambulatory Visit
Admit: 2021-09-07 | Discharge: 2021-09-08 | Payer: PRIVATE HEALTH INSURANCE | Attending: Physical Medicine & Rehabilitation | Primary: Physical Medicine & Rehabilitation

## 2021-09-07 DIAGNOSIS — M479 Spondylosis, unspecified: Principal | ICD-10-CM

## 2021-09-20 DIAGNOSIS — M479 Spondylosis, unspecified: Principal | ICD-10-CM

## 2021-09-22 ENCOUNTER — Ambulatory Visit
Admit: 2021-09-22 | Payer: PRIVATE HEALTH INSURANCE | Attending: Rehabilitative and Restorative Service Providers" | Primary: Rehabilitative and Restorative Service Providers"

## 2021-09-25 ENCOUNTER — Ambulatory Visit: Admit: 2021-09-25 | Discharge: 2021-09-26 | Payer: PRIVATE HEALTH INSURANCE

## 2021-09-29 DIAGNOSIS — Z6839 Body mass index (BMI) 39.0-39.9, adult: Principal | ICD-10-CM

## 2021-09-29 DIAGNOSIS — E6609 Other obesity due to excess calories: Principal | ICD-10-CM

## 2021-09-29 DIAGNOSIS — Z9884 Bariatric surgery status: Principal | ICD-10-CM

## 2021-10-02 MED ORDER — METFORMIN ER 500 MG TABLET,EXTENDED RELEASE 24 HR
ORAL_TABLET | Freq: Every day | ORAL | 1 refills | 90 days | Status: CP
Start: 2021-10-02 — End: ?
  Filled 2021-10-10: qty 180, 90d supply, fill #0

## 2021-10-03 MED ORDER — METAXALONE 800 MG TABLET
ORAL_TABLET | Freq: Three times a day (TID) | ORAL | 0 refills | 10 days | Status: CP | PRN
Start: 2021-10-03 — End: ?
  Filled 2021-10-10: qty 30, 10d supply, fill #0

## 2021-10-10 DIAGNOSIS — Z6839 Body mass index (BMI) 39.0-39.9, adult: Principal | ICD-10-CM

## 2021-10-10 DIAGNOSIS — Z9884 Bariatric surgery status: Principal | ICD-10-CM

## 2021-10-10 DIAGNOSIS — E6609 Other obesity due to excess calories: Principal | ICD-10-CM

## 2021-10-12 MED ORDER — WEGOVY 2.4 MG/0.75 ML SUBCUTANEOUS PEN INJECTOR
SUBCUTANEOUS | 6 refills | 0 days | Status: CP
Start: 2021-10-12 — End: ?

## 2021-10-24 ENCOUNTER — Ambulatory Visit: Admit: 2021-10-24 | Discharge: 2021-10-25 | Payer: PRIVATE HEALTH INSURANCE | Attending: Family | Primary: Family

## 2021-10-24 DIAGNOSIS — E6609 Other obesity due to excess calories: Principal | ICD-10-CM

## 2021-10-24 DIAGNOSIS — Z6839 Body mass index (BMI) 39.0-39.9, adult: Principal | ICD-10-CM

## 2021-10-24 DIAGNOSIS — Z9884 Bariatric surgery status: Principal | ICD-10-CM

## 2021-10-24 MED ORDER — TOPIRAMATE 50 MG TABLET
ORAL_TABLET | Freq: Every evening | ORAL | 0 refills | 30 days | Status: CP
Start: 2021-10-24 — End: 2022-10-24
  Filled 2021-11-09: qty 30, 30d supply, fill #0

## 2021-10-24 MED ORDER — METFORMIN ER 500 MG TABLET,EXTENDED RELEASE 24 HR
ORAL_TABLET | Freq: Every day | ORAL | 2 refills | 90 days | Status: CP
Start: 2021-10-24 — End: ?
  Filled 2022-01-22: qty 180, 90d supply, fill #0

## 2021-11-13 MED ORDER — CLOTRIMAZOLE-BETAMETHASONE 1 %-0.05 % TOPICAL CREAM
Freq: Two times a day (BID) | TOPICAL | 0 refills | 0 days | Status: CP
Start: 2021-11-13 — End: 2022-11-13

## 2021-11-22 ENCOUNTER — Ambulatory Visit: Admit: 2021-11-22 | Discharge: 2021-11-23 | Payer: PRIVATE HEALTH INSURANCE

## 2021-11-22 ENCOUNTER — Ambulatory Visit
Admit: 2021-11-22 | Discharge: 2021-11-23 | Payer: PRIVATE HEALTH INSURANCE | Attending: Physical Medicine & Rehabilitation | Primary: Physical Medicine & Rehabilitation

## 2021-11-22 DIAGNOSIS — Z1231 Encounter for screening mammogram for malignant neoplasm of breast: Principal | ICD-10-CM

## 2021-11-22 DIAGNOSIS — M479 Spondylosis, unspecified: Principal | ICD-10-CM

## 2021-11-29 DIAGNOSIS — Z1231 Encounter for screening mammogram for malignant neoplasm of breast: Principal | ICD-10-CM

## 2021-12-06 DIAGNOSIS — Z1211 Encounter for screening for malignant neoplasm of colon: Principal | ICD-10-CM

## 2022-01-15 ENCOUNTER — Ambulatory Visit: Admit: 2022-01-15 | Discharge: 2022-01-16 | Payer: PRIVATE HEALTH INSURANCE

## 2022-01-15 DIAGNOSIS — R109 Unspecified abdominal pain: Principal | ICD-10-CM

## 2022-01-15 DIAGNOSIS — N939 Abnormal uterine and vaginal bleeding, unspecified: Principal | ICD-10-CM

## 2022-01-15 DIAGNOSIS — R112 Nausea with vomiting, unspecified: Principal | ICD-10-CM

## 2022-01-15 DIAGNOSIS — R197 Diarrhea, unspecified: Principal | ICD-10-CM

## 2022-01-15 MED ORDER — ONDANSETRON HCL 4 MG TABLET
ORAL_TABLET | Freq: Three times a day (TID) | ORAL | 0 refills | 7 days | Status: CP | PRN
Start: 2022-01-15 — End: ?

## 2022-01-15 MED ORDER — NORETHINDRONE ACETATE 5 MG TABLET
ORAL_TABLET | Freq: Every day | ORAL | 2 refills | 90.00000 days | Status: CP
Start: 2022-01-15 — End: 2022-04-15

## 2022-01-15 MED ORDER — TOPIRAMATE 50 MG TABLET
ORAL_TABLET | Freq: Every evening | ORAL | 0 refills | 90 days | Status: CP
Start: 2022-01-15 — End: 2023-01-15
  Filled 2022-01-22: qty 90, 90d supply, fill #0

## 2022-01-15 MED ORDER — CLOTRIMAZOLE-BETAMETHASONE 1 %-0.05 % TOPICAL CREAM
Freq: Two times a day (BID) | TOPICAL | 0 refills | 0 days | Status: CP
Start: 2022-01-15 — End: 2023-01-15

## 2022-01-19 MED ORDER — METHYLPREDNISOLONE 4 MG TABLETS IN A DOSE PACK
0 refills | 0 days | Status: CP
Start: 2022-01-19 — End: ?
  Filled 2022-01-22: qty 21, 6d supply, fill #0

## 2022-01-24 DIAGNOSIS — E6609 Other obesity due to excess calories: Principal | ICD-10-CM

## 2022-01-24 DIAGNOSIS — Z6839 Body mass index (BMI) 39.0-39.9, adult: Principal | ICD-10-CM

## 2022-01-24 DIAGNOSIS — Z9884 Bariatric surgery status: Principal | ICD-10-CM

## 2022-02-02 ENCOUNTER — Ambulatory Visit: Admit: 2022-02-02 | Discharge: 2022-02-02 | Payer: PRIVATE HEALTH INSURANCE

## 2022-02-02 ENCOUNTER — Ambulatory Visit
Admit: 2022-02-02 | Discharge: 2022-02-02 | Payer: PRIVATE HEALTH INSURANCE | Attending: Physical Medicine & Rehabilitation | Primary: Physical Medicine & Rehabilitation

## 2022-02-02 DIAGNOSIS — M47817 Spondylosis without myelopathy or radiculopathy, lumbosacral region: Principal | ICD-10-CM

## 2022-02-14 MED ORDER — NORETHINDRONE ACETATE 5 MG TABLET
ORAL_TABLET | Freq: Every day | ORAL | 2 refills | 90.00000 days | Status: CP
Start: 2022-02-14 — End: 2022-05-15
  Filled 2022-02-21: qty 90, 90d supply, fill #0

## 2022-02-20 MED ORDER — POLYETHYLENE GLYCOL 3350 17 GRAM/DOSE ORAL POWDER
ORAL | 0 refills | 0.00000 days | Status: CP
Start: 2022-02-20 — End: ?

## 2022-02-20 MED ORDER — BISACODYL 5 MG TABLET,DELAYED RELEASE
ORAL_TABLET | Freq: Once | ORAL | 0 refills | 1 days | Status: CP
Start: 2022-02-20 — End: 2022-02-20
  Filled 2022-03-07: qty 238, 1d supply, fill #0
  Filled 2022-03-07: qty 119, 1d supply, fill #0

## 2022-02-20 MED ORDER — SIMETHICONE 125 MG CHEWABLE TABLET
ORAL_TABLET | ORAL | 0 refills | 0 days | Status: CP
Start: 2022-02-20 — End: ?
  Filled 2022-03-07: qty 4, 1d supply, fill #0

## 2022-02-21 MED FILL — METFORMIN ER 500 MG TABLET,EXTENDED RELEASE 24 HR: ORAL | 90 days supply | Qty: 180 | Fill #1

## 2022-02-22 ENCOUNTER — Institutional Professional Consult (permissible substitution): Admit: 2022-02-22 | Discharge: 2022-02-23 | Payer: PRIVATE HEALTH INSURANCE

## 2022-03-05 DIAGNOSIS — E6609 Other obesity due to excess calories: Principal | ICD-10-CM

## 2022-03-05 DIAGNOSIS — Z9884 Bariatric surgery status: Principal | ICD-10-CM

## 2022-03-05 DIAGNOSIS — Z6839 Body mass index (BMI) 39.0-39.9, adult: Principal | ICD-10-CM

## 2022-03-07 ENCOUNTER — Ambulatory Visit
Admit: 2022-03-07 | Discharge: 2022-03-08 | Payer: PRIVATE HEALTH INSURANCE | Attending: Physical Medicine & Rehabilitation | Primary: Physical Medicine & Rehabilitation

## 2022-03-07 DIAGNOSIS — M479 Spondylosis, unspecified: Principal | ICD-10-CM

## 2022-03-07 MED ORDER — WEGOVY 2.4 MG/0.75 ML SUBCUTANEOUS PEN INJECTOR
6 refills | 0 days | Status: CP
Start: 2022-03-07 — End: ?

## 2022-03-07 MED FILL — BISACODYL 5 MG TABLET,DELAYED RELEASE: ORAL | 1 days supply | Qty: 2 | Fill #0

## 2022-03-12 ENCOUNTER — Ambulatory Visit
Admit: 2022-03-12 | Discharge: 2022-03-13 | Payer: PRIVATE HEALTH INSURANCE | Attending: Student in an Organized Health Care Education/Training Program | Primary: Student in an Organized Health Care Education/Training Program

## 2022-03-12 DIAGNOSIS — J069 Acute upper respiratory infection, unspecified: Principal | ICD-10-CM

## 2022-03-12 DIAGNOSIS — J0100 Acute maxillary sinusitis, unspecified: Principal | ICD-10-CM

## 2022-03-28 ENCOUNTER — Encounter
Admit: 2022-03-28 | Discharge: 2022-03-28 | Payer: PRIVATE HEALTH INSURANCE | Attending: Student in an Organized Health Care Education/Training Program | Primary: Student in an Organized Health Care Education/Training Program

## 2022-03-28 ENCOUNTER — Ambulatory Visit: Admit: 2022-03-28 | Discharge: 2022-03-28 | Payer: PRIVATE HEALTH INSURANCE

## 2022-04-10 ENCOUNTER — Ambulatory Visit: Admit: 2022-04-10 | Discharge: 2022-04-11 | Payer: PRIVATE HEALTH INSURANCE

## 2022-04-10 DIAGNOSIS — J4520 Mild intermittent asthma, uncomplicated: Principal | ICD-10-CM

## 2022-04-10 DIAGNOSIS — Z0000 Encounter for general adult medical examination without abnormal findings: Principal | ICD-10-CM

## 2022-04-10 MED ORDER — MONTELUKAST 10 MG TABLET
ORAL_TABLET | Freq: Every evening | ORAL | 3 refills | 90 days | Status: CP
Start: 2022-04-10 — End: ?
  Filled 2022-04-11: qty 90, 90d supply, fill #0

## 2022-04-10 MED ORDER — FLUTICASONE PROPIONATE 50 MCG/ACTUATION NASAL SPRAY,SUSPENSION
Freq: Every day | NASAL | 11 refills | 60 days | Status: CP
Start: 2022-04-10 — End: 2024-02-25
  Filled 2022-04-11: qty 16, 30d supply, fill #0

## 2022-04-10 MED ORDER — ALBUTEROL SULFATE HFA 90 MCG/ACTUATION AEROSOL INHALER
RESPIRATORY_TRACT | 11 refills | 0 days | Status: CP | PRN
Start: 2022-04-10 — End: 2023-04-10
  Filled 2022-04-11: qty 8.5, 16d supply, fill #0

## 2022-04-18 MED ORDER — TOPIRAMATE 50 MG TABLET
ORAL_TABLET | Freq: Every evening | ORAL | 0 refills | 90 days | Status: CP
Start: 2022-04-18 — End: 2023-04-18
  Filled 2022-04-20: qty 90, 90d supply, fill #0

## 2022-04-22 ENCOUNTER — Telehealth: Admit: 2022-04-22 | Discharge: 2022-04-23 | Payer: PRIVATE HEALTH INSURANCE

## 2022-04-22 DIAGNOSIS — J329 Chronic sinusitis, unspecified: Principal | ICD-10-CM

## 2022-04-22 MED ORDER — PREDNISONE 20 MG TABLET
ORAL_TABLET | Freq: Every day | ORAL | 0 refills | 5 days | Status: CP
Start: 2022-04-22 — End: 2022-04-27

## 2022-04-22 MED ORDER — AMOXICILLIN 875 MG-POTASSIUM CLAVULANATE 125 MG TABLET
ORAL_TABLET | Freq: Two times a day (BID) | ORAL | 0 refills | 7 days | Status: CP
Start: 2022-04-22 — End: 2022-04-29

## 2022-04-25 DIAGNOSIS — E6609 Other obesity due to excess calories: Principal | ICD-10-CM

## 2022-04-25 DIAGNOSIS — Z6839 Body mass index (BMI) 39.0-39.9, adult: Principal | ICD-10-CM

## 2022-04-25 DIAGNOSIS — Z9884 Bariatric surgery status: Principal | ICD-10-CM

## 2022-05-02 ENCOUNTER — Ambulatory Visit
Admit: 2022-05-02 | Discharge: 2022-05-03 | Payer: PRIVATE HEALTH INSURANCE | Attending: Physical Medicine & Rehabilitation | Primary: Physical Medicine & Rehabilitation

## 2022-05-02 ENCOUNTER — Ambulatory Visit
Admit: 2022-05-02 | Discharge: 2022-05-03 | Payer: PRIVATE HEALTH INSURANCE | Attending: Internal Medicine | Primary: Internal Medicine

## 2022-05-02 ENCOUNTER — Ambulatory Visit: Admit: 2022-05-02 | Discharge: 2022-05-03 | Payer: PRIVATE HEALTH INSURANCE

## 2022-05-02 DIAGNOSIS — Z6839 Body mass index (BMI) 39.0-39.9, adult: Principal | ICD-10-CM

## 2022-05-02 DIAGNOSIS — E538 Deficiency of other specified B group vitamins: Principal | ICD-10-CM

## 2022-05-02 DIAGNOSIS — E559 Vitamin D deficiency, unspecified: Principal | ICD-10-CM

## 2022-05-02 DIAGNOSIS — R030 Elevated blood-pressure reading, without diagnosis of hypertension: Principal | ICD-10-CM

## 2022-05-02 DIAGNOSIS — E6609 Other obesity due to excess calories: Principal | ICD-10-CM

## 2022-05-02 DIAGNOSIS — Z6831 Body mass index (BMI) 31.0-31.9, adult: Principal | ICD-10-CM

## 2022-05-02 DIAGNOSIS — M47817 Spondylosis without myelopathy or radiculopathy, lumbosacral region: Principal | ICD-10-CM

## 2022-05-02 DIAGNOSIS — Z6832 Body mass index (BMI) 32.0-32.9, adult: Principal | ICD-10-CM

## 2022-05-02 DIAGNOSIS — Z9884 Bariatric surgery status: Principal | ICD-10-CM

## 2022-05-02 MED ORDER — WEGOVY 2.4 MG/0.75 ML SUBCUTANEOUS PEN INJECTOR
SUBCUTANEOUS | 3 refills | 0 days | Status: CP
Start: 2022-05-02 — End: ?

## 2022-05-02 MED ORDER — TOPIRAMATE 50 MG TABLET
ORAL_TABLET | Freq: Every evening | ORAL | 3 refills | 30 days | Status: CP
Start: 2022-05-02 — End: 2022-05-02

## 2022-05-02 MED ORDER — TOPIRAMATE 25 MG TABLET
ORAL_TABLET | Freq: Every evening | ORAL | 3 refills | 30 days | Status: CP
Start: 2022-05-02 — End: ?

## 2022-05-02 MED ORDER — ERGOCALCIFEROL (VITAMIN D2) 1,250 MCG (50,000 UNIT) CAPSULE
ORAL_CAPSULE | ORAL | 0 refills | 84 days | Status: CP
Start: 2022-05-02 — End: ?

## 2022-06-01 DIAGNOSIS — R42 Dizziness and giddiness: Principal | ICD-10-CM

## 2022-06-01 MED ORDER — MECLIZINE 12.5 MG TABLET
ORAL_TABLET | Freq: Three times a day (TID) | ORAL | 0 refills | 20 days | Status: CP | PRN
Start: 2022-06-01 — End: 2022-07-01

## 2022-07-08 DIAGNOSIS — Z9884 Bariatric surgery status: Principal | ICD-10-CM

## 2022-08-01 ENCOUNTER — Ambulatory Visit: Admit: 2022-08-01 | Payer: PRIVATE HEALTH INSURANCE

## 2022-08-06 DIAGNOSIS — Z9884 Bariatric surgery status: Principal | ICD-10-CM

## 2022-08-07 ENCOUNTER — Ambulatory Visit: Admit: 2022-08-07 | Discharge: 2022-08-07 | Payer: PRIVATE HEALTH INSURANCE

## 2022-08-07 DIAGNOSIS — Z6832 Body mass index (BMI) 32.0-32.9, adult: Principal | ICD-10-CM

## 2022-08-07 DIAGNOSIS — E559 Vitamin D deficiency, unspecified: Principal | ICD-10-CM

## 2022-08-07 DIAGNOSIS — Z6839 Body mass index (BMI) 39.0-39.9, adult: Principal | ICD-10-CM

## 2022-08-07 DIAGNOSIS — J4520 Mild intermittent asthma, uncomplicated: Principal | ICD-10-CM

## 2022-08-07 DIAGNOSIS — E6609 Other obesity due to excess calories: Principal | ICD-10-CM

## 2022-08-07 DIAGNOSIS — Z9884 Bariatric surgery status: Principal | ICD-10-CM

## 2022-08-07 DIAGNOSIS — Z6831 Body mass index (BMI) 31.0-31.9, adult: Principal | ICD-10-CM

## 2022-08-07 MED ORDER — ERGOCALCIFEROL (VITAMIN D2) 1,250 MCG (50,000 UNIT) CAPSULE
ORAL_CAPSULE | ORAL | 0 refills | 42 days | Status: CP
Start: 2022-08-07 — End: ?

## 2022-08-08 DIAGNOSIS — Z6839 Body mass index (BMI) 39.0-39.9, adult: Principal | ICD-10-CM

## 2022-08-08 DIAGNOSIS — Z9884 Bariatric surgery status: Principal | ICD-10-CM

## 2022-08-08 DIAGNOSIS — E6609 Other obesity due to excess calories: Principal | ICD-10-CM

## 2022-08-08 MED ORDER — TOPIRAMATE 25 MG TABLET
ORAL_TABLET | Freq: Every evening | ORAL | 3 refills | 30 days | Status: CP
Start: 2022-08-08 — End: ?

## 2022-08-08 MED ORDER — WEGOVY 2.4 MG/0.75 ML SUBCUTANEOUS PEN INJECTOR
SUBCUTANEOUS | 3 refills | 0 days | Status: CP
Start: 2022-08-08 — End: 2022-08-08

## 2022-08-10 ENCOUNTER — Ambulatory Visit
Admit: 2022-08-10 | Discharge: 2022-08-11 | Payer: PRIVATE HEALTH INSURANCE | Attending: Physical Medicine & Rehabilitation | Primary: Physical Medicine & Rehabilitation

## 2022-08-10 DIAGNOSIS — M5410 Radiculopathy, site unspecified: Principal | ICD-10-CM

## 2022-08-13 MED FILL — METFORMIN ER 500 MG TABLET,EXTENDED RELEASE 24 HR: ORAL | 90 days supply | Qty: 180 | Fill #2

## 2022-08-13 MED FILL — FLUTICASONE PROPIONATE 50 MCG/ACTUATION NASAL SPRAY,SUSPENSION: NASAL | 30 days supply | Qty: 16 | Fill #0

## 2022-08-13 MED FILL — MONTELUKAST 10 MG TABLET: ORAL | 90 days supply | Qty: 90 | Fill #0

## 2022-08-22 ENCOUNTER — Ambulatory Visit: Admit: 2022-08-22 | Discharge: 2022-08-23 | Payer: PRIVATE HEALTH INSURANCE

## 2022-08-29 ENCOUNTER — Ambulatory Visit
Admit: 2022-08-29 | Discharge: 2022-08-30 | Payer: PRIVATE HEALTH INSURANCE | Attending: Internal Medicine | Primary: Internal Medicine

## 2022-08-29 DIAGNOSIS — E669 Obesity, unspecified: Principal | ICD-10-CM

## 2022-08-29 DIAGNOSIS — E559 Vitamin D deficiency, unspecified: Principal | ICD-10-CM

## 2022-08-29 DIAGNOSIS — Z9884 Bariatric surgery status: Principal | ICD-10-CM

## 2022-08-29 DIAGNOSIS — E6609 Other obesity due to excess calories: Principal | ICD-10-CM

## 2022-08-29 DIAGNOSIS — Z6831 Body mass index (BMI) 31.0-31.9, adult: Principal | ICD-10-CM

## 2022-08-29 DIAGNOSIS — Z6832 Body mass index (BMI) 32.0-32.9, adult: Principal | ICD-10-CM

## 2022-08-29 DIAGNOSIS — Z5181 Encounter for therapeutic drug level monitoring: Principal | ICD-10-CM

## 2022-08-29 MED ORDER — TOPIRAMATE 100 MG TABLET
ORAL_TABLET | Freq: Every evening | ORAL | 1 refills | 90 days | Status: CP
Start: 2022-08-29 — End: ?
  Filled 2022-09-19: qty 90, 90d supply, fill #0

## 2022-08-29 MED ORDER — ERGOCALCIFEROL (VITAMIN D2) 1,250 MCG (50,000 UNIT) CAPSULE
ORAL_CAPSULE | ORAL | 0 refills | 84 days | Status: CP
Start: 2022-08-29 — End: ?

## 2022-08-29 MED ORDER — PHENTERMINE 37.5 MG TABLET
ORAL_TABLET | 5 refills | 0 days | Status: CP
Start: 2022-08-29 — End: ?

## 2022-09-18 DIAGNOSIS — M5417 Radiculopathy, lumbosacral region: Principal | ICD-10-CM

## 2022-09-27 ENCOUNTER — Ambulatory Visit: Admit: 2022-09-27 | Discharge: 2022-09-28 | Payer: PRIVATE HEALTH INSURANCE

## 2022-09-27 DIAGNOSIS — E559 Vitamin D deficiency, unspecified: Principal | ICD-10-CM

## 2022-09-27 DIAGNOSIS — N644 Mastodynia: Principal | ICD-10-CM

## 2022-09-27 DIAGNOSIS — Z1231 Encounter for screening mammogram for malignant neoplasm of breast: Principal | ICD-10-CM

## 2022-09-27 DIAGNOSIS — E669 Obesity, unspecified: Principal | ICD-10-CM

## 2022-09-27 DIAGNOSIS — Z9884 Bariatric surgery status: Principal | ICD-10-CM

## 2022-09-27 DIAGNOSIS — Z5181 Encounter for therapeutic drug level monitoring: Principal | ICD-10-CM

## 2022-10-05 DIAGNOSIS — Z6839 Body mass index (BMI) 39.0-39.9, adult: Principal | ICD-10-CM

## 2022-10-05 DIAGNOSIS — Z9884 Bariatric surgery status: Principal | ICD-10-CM

## 2022-10-05 DIAGNOSIS — E6609 Other obesity due to excess calories: Principal | ICD-10-CM

## 2022-10-05 DIAGNOSIS — E669 Obesity, unspecified: Principal | ICD-10-CM

## 2022-10-08 MED ORDER — WEGOVY 2.4 MG/0.75 ML SUBCUTANEOUS PEN INJECTOR
SUBCUTANEOUS | 3 refills | 0 days | Status: CP
Start: 2022-10-08 — End: ?
  Filled 2022-10-30: qty 3, 28d supply, fill #0

## 2022-10-08 MED ORDER — PHENTERMINE 37.5 MG TABLET
ORAL_TABLET | 5 refills | 0 days | Status: CP
Start: 2022-10-08 — End: ?
  Filled 2022-10-23: qty 30, 30d supply, fill #0

## 2022-10-11 DIAGNOSIS — Z9884 Bariatric surgery status: Principal | ICD-10-CM

## 2022-10-11 MED ORDER — ERGOCALCIFEROL (VITAMIN D2) 1,250 MCG (50,000 UNIT) CAPSULE
ORAL_CAPSULE | ORAL | 0 refills | 84 days | Status: CP
Start: 2022-10-11 — End: ?

## 2022-10-12 ENCOUNTER — Telehealth: Admit: 2022-10-12 | Discharge: 2022-10-13 | Payer: PRIVATE HEALTH INSURANCE

## 2022-10-12 DIAGNOSIS — H8110 Benign paroxysmal vertigo, unspecified ear: Principal | ICD-10-CM

## 2022-10-12 MED ORDER — MECLIZINE 25 MG TABLET
ORAL_TABLET | Freq: Three times a day (TID) | ORAL | 1 refills | 10 days | Status: CP | PRN
Start: 2022-10-12 — End: 2023-10-12

## 2022-10-15 DIAGNOSIS — E6609 Other obesity due to excess calories: Principal | ICD-10-CM

## 2022-10-15 DIAGNOSIS — Z6839 Body mass index (BMI) 39.0-39.9, adult: Principal | ICD-10-CM

## 2022-10-15 DIAGNOSIS — E669 Obesity, unspecified: Principal | ICD-10-CM

## 2022-10-15 DIAGNOSIS — Z9884 Bariatric surgery status: Principal | ICD-10-CM

## 2022-10-17 ENCOUNTER — Ambulatory Visit: Admit: 2022-10-17 | Discharge: 2022-10-18 | Payer: PRIVATE HEALTH INSURANCE

## 2022-10-25 DIAGNOSIS — Z9884 Bariatric surgery status: Principal | ICD-10-CM

## 2022-10-25 DIAGNOSIS — R635 Abnormal weight gain: Principal | ICD-10-CM

## 2022-11-02 DIAGNOSIS — E669 Obesity, unspecified: Principal | ICD-10-CM

## 2022-11-02 DIAGNOSIS — Z6835 Body mass index (BMI) 35.0-35.9, adult: Principal | ICD-10-CM

## 2022-11-02 DIAGNOSIS — Z9884 Bariatric surgery status: Principal | ICD-10-CM

## 2022-11-02 MED ORDER — SEMAGLUTIDE (WEIGHT LOSS) 0.5 MG/0.5 ML SUBCUTANEOUS PEN INJECTOR
SUBCUTANEOUS | 0 refills | 0 days | Status: CP
Start: 2022-11-02 — End: ?

## 2022-11-02 MED ORDER — SEMAGLUTIDE (WEIGHT LOSS) 0.25 MG/0.5 ML SUBCUTANEOUS PEN INJECTOR
SUBCUTANEOUS | 0 refills | 0 days | Status: CP
Start: 2022-11-02 — End: ?

## 2022-11-02 MED ORDER — SEMAGLUTIDE (WEIGHT LOSS) 1 MG/0.5 ML SUBCUTANEOUS PEN INJECTOR
SUBCUTANEOUS | 0 refills | 0 days | Status: CP
Start: 2022-11-02 — End: ?

## 2022-11-05 MED ORDER — SEMAGLUTIDE (WEIGHT LOSS) 0.5 MG/0.5 ML SUBCUTANEOUS PEN INJECTOR
SUBCUTANEOUS | 0 refills | 0 days | Status: CP
Start: 2022-11-05 — End: ?

## 2022-11-05 MED ORDER — SEMAGLUTIDE (WEIGHT LOSS) 0.25 MG/0.5 ML SUBCUTANEOUS PEN INJECTOR
SUBCUTANEOUS | 0 refills | 0 days | Status: CP
Start: 2022-11-05 — End: ?

## 2022-11-05 MED ORDER — SEMAGLUTIDE (WEIGHT LOSS) 1 MG/0.5 ML SUBCUTANEOUS PEN INJECTOR
SUBCUTANEOUS | 0 refills | 0 days | Status: CP
Start: 2022-11-05 — End: ?

## 2022-11-08 DIAGNOSIS — Z9884 Bariatric surgery status: Principal | ICD-10-CM

## 2022-11-08 DIAGNOSIS — E6609 Other obesity due to excess calories: Principal | ICD-10-CM

## 2022-11-08 DIAGNOSIS — Z6839 Body mass index (BMI) 39.0-39.9, adult: Principal | ICD-10-CM

## 2022-11-08 MED ORDER — METFORMIN ER 500 MG TABLET,EXTENDED RELEASE 24 HR
ORAL_TABLET | Freq: Every day | ORAL | 2 refills | 90 days | Status: CP
Start: 2022-11-08 — End: ?
  Filled 2022-11-12: qty 180, 90d supply, fill #0

## 2022-11-19 ENCOUNTER — Ambulatory Visit: Admit: 2022-11-19 | Discharge: 2022-11-20 | Payer: PRIVATE HEALTH INSURANCE

## 2022-11-19 DIAGNOSIS — M5416 Radiculopathy, lumbar region: Principal | ICD-10-CM

## 2022-11-21 ENCOUNTER — Telehealth
Admit: 2022-11-21 | Discharge: 2022-11-22 | Payer: PRIVATE HEALTH INSURANCE | Attending: Internal Medicine | Primary: Internal Medicine

## 2022-11-21 DIAGNOSIS — Z9884 Bariatric surgery status: Principal | ICD-10-CM

## 2022-11-21 DIAGNOSIS — E669 Obesity, unspecified: Principal | ICD-10-CM

## 2022-11-21 MED ORDER — SEMAGLUTIDE (WEIGHT LOSS) 2.4 MG/0.75 ML SUBCUTANEOUS PEN INJECTOR
SUBCUTANEOUS | 0 refills | 0 days | Status: CP
Start: 2022-11-21 — End: ?
  Filled 2023-01-03: qty 2, 28d supply, fill #0

## 2022-11-21 MED ORDER — SEMAGLUTIDE (WEIGHT LOSS) 1 MG/0.5 ML SUBCUTANEOUS PEN INJECTOR
SUBCUTANEOUS | 0 refills | 0 days | Status: CP
Start: 2022-11-21 — End: ?

## 2022-11-21 MED ORDER — SEMAGLUTIDE (WEIGHT LOSS) 0.5 MG/0.5 ML SUBCUTANEOUS PEN INJECTOR
SUBCUTANEOUS | 0 refills | 0 days | Status: CP
Start: 2022-11-21 — End: ?

## 2022-11-21 MED ORDER — SEMAGLUTIDE (WEIGHT LOSS) 1.7 MG/0.75 ML SUBCUTANEOUS PEN INJECTOR
SUBCUTANEOUS | 0 refills | 28 days | Status: CP
Start: 2022-11-21 — End: ?

## 2022-11-23 MED ORDER — ERGOCALCIFEROL (VITAMIN D2) 1,250 MCG (50,000 UNIT) CAPSULE
ORAL_CAPSULE | ORAL | 1 refills | 84 days | Status: CP
Start: 2022-11-23 — End: ?

## 2022-11-28 ENCOUNTER — Ambulatory Visit: Admit: 2022-11-28 | Discharge: 2022-11-29 | Payer: PRIVATE HEALTH INSURANCE

## 2022-11-28 ENCOUNTER — Ambulatory Visit: Admit: 2022-11-28 | Payer: PRIVATE HEALTH INSURANCE

## 2022-11-28 ENCOUNTER — Ambulatory Visit
Admit: 2022-11-28 | Discharge: 2022-11-29 | Payer: PRIVATE HEALTH INSURANCE | Attending: Physical Medicine & Rehabilitation | Primary: Physical Medicine & Rehabilitation

## 2022-11-28 ENCOUNTER — Ambulatory Visit
Admit: 2022-11-28 | Discharge: 2022-11-29 | Payer: PRIVATE HEALTH INSURANCE | Attending: Internal Medicine | Primary: Internal Medicine

## 2022-12-05 NOTE — Unmapped (Signed)
[facility] Specialty Medication Onboarding    Specialty Medication: WEGOVY 0.5 mg/0.5 mL injection pen (semaglutide (weight loss))  Prior Authorization: Not Required   Financial Assistance: No - copay  <$25  Final Copay/Day Supply: $24.99 / 28    Insurance Restrictions: None     Notes to Pharmacist: dose change  Credit Card on File: yes    The triage team has completed the benefits investigation and has determined that the patient is able to fill this medication at [facility]. Please contact the patient to complete the onboarding or follow up with the prescribing physician as needed.

## 2022-12-05 NOTE — Unmapped (Signed)
[facility] has reached out to this patient via MyChart to onboard them to our Specialty Lite services for their Wegovy. They will now receive proactive outreach from the pharmacy team for refills.    Lafreda Casebeer [place], PharmD  [facility] Pharmacist

## 2022-12-11 MED ORDER — VALACYCLOVIR 1 GRAM TABLET
0 refills | 0 days
Start: 2022-12-11 — End: ?

## 2022-12-11 MED ORDER — TRANEXAMIC ACID 650 MG TABLET
0 refills | 0 days
Start: 2022-12-11 — End: ?

## 2022-12-12 MED ORDER — TRANEXAMIC ACID 650 MG TABLET
0 refills | 0 days
Start: 2022-12-12 — End: ?

## 2022-12-12 MED FILL — PHENTERMINE 37.5 MG TABLET: 30 days supply | Qty: 30 | Fill #1

## 2022-12-12 NOTE — Unmapped (Signed)
My Chart message sent to patient to clarify how she was taking Valtrex and if she is experiencing any symptoms currently. Awaiting pt response.

## 2022-12-13 NOTE — Unmapped (Signed)
Voicemail left now on pt phone to check my chart messages at earliest convenience.

## 2022-12-14 MED ORDER — VALACYCLOVIR 1 GRAM TABLET
0 refills | 0 days
Start: 2022-12-14 — End: ?

## 2022-12-14 NOTE — Unmapped (Signed)
Request refused until patient can provide dosing information and frequency.

## 2022-12-19 MED ORDER — VALACYCLOVIR 1 GRAM TABLET
ORAL_TABLET | Freq: Two times a day (BID) | ORAL | 3 refills | 8 days | Status: CP
Start: 2022-12-19 — End: 2022-12-20

## 2022-12-20 NOTE — Unmapped (Signed)
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

## 2022-12-28 DIAGNOSIS — E669 Obesity, unspecified: Principal | ICD-10-CM

## 2022-12-28 DIAGNOSIS — Z9884 Bariatric surgery status: Principal | ICD-10-CM

## 2022-12-28 MED ORDER — PHENTERMINE 37.5 MG TABLET
ORAL_TABLET | 5 refills | 0 days | Status: CP
Start: 2022-12-28 — End: ?

## 2023-01-01 DIAGNOSIS — Z9884 Bariatric surgery status: Principal | ICD-10-CM

## 2023-01-01 DIAGNOSIS — E669 Obesity, unspecified: Principal | ICD-10-CM

## 2023-01-01 MED ORDER — PHENTERMINE 37.5 MG TABLET
ORAL_TABLET | 5 refills | 0 days | Status: CP
Start: 2023-01-01 — End: ?
  Filled 2023-01-03: qty 30, 30d supply, fill #0

## 2023-01-01 NOTE — Unmapped (Signed)
Patient sent in refill request for her phetermine to be filled at [facility] shared pharmacy. Message sent to Dr. Bronson Curb.

## 2023-01-04 ENCOUNTER — Ambulatory Visit: Admit: 2023-01-04 | Discharge: 2023-01-05 | Payer: PRIVATE HEALTH INSURANCE

## 2023-01-04 DIAGNOSIS — N939 Abnormal uterine and vaginal bleeding, unspecified: Principal | ICD-10-CM

## 2023-01-04 DIAGNOSIS — Z01419 Encounter for gynecological examination (general) (routine) without abnormal findings: Principal | ICD-10-CM

## 2023-01-04 DIAGNOSIS — Z113 Encounter for screening for infections with a predominantly sexual mode of transmission: Principal | ICD-10-CM

## 2023-01-04 LAB — URINALYSIS WITH MICROSCOPY
BILIRUBIN UA: NEGATIVE
BLOOD UA: NEGATIVE
GLUCOSE UA: NEGATIVE
KETONES UA: NEGATIVE
NITRITE UA: NEGATIVE
PH UA: 6.5 (ref 5.0–9.0)
RBC UA: 1 /HPF (ref <=4)
SPECIFIC GRAVITY UA: 1.027 (ref 1.003–1.030)
SQUAMOUS EPITHELIAL: <1 /HPF (ref 0–5)
UROBILINOGEN UA: 3 — AB
WBC UA: 30 /HPF — ABNORMAL HIGH (ref 0–5)

## 2023-01-04 MED ORDER — NORETHINDRONE ACETATE 5 MG TABLET
ORAL_TABLET | Freq: Every day | ORAL | 2 refills | 90.00000 days | Status: CP
Start: 2023-01-04 — End: 2023-04-04
  Filled 2023-01-03: qty 90, 90d supply, fill #1

## 2023-01-04 NOTE — Unmapped (Signed)
Assessment & Plan  Abnormal uterine bleeding  Stable sxs on high-dose oral progestin s/p ablation to reduce the failure rate    Orders:    norethindrone (AYGESTIN) 5 mg tablet; Take 1 tablet (5 mg total) by mouth daily.    Encounter for gynecological examination without abnormal finding    Orders:    norethindrone (AYGESTIN) 5 mg tablet; Take 1 tablet (5 mg total) by mouth daily.    Chlamydia/Gonorrhoeae NAA    HIV Antigen/Antibody Combo; Future    Syphilis Screen; Future    Pap Smear    HIV Antigen/Antibody Combo    Syphilis Screen    Urine Culture    Urinalysis with Microscopy    Routine screening for STI (sexually transmitted infection)    Orders:    Chlamydia/Gonorrhoeae NAA    HIV Antigen/Antibody Combo; Future    Syphilis Screen; Future    HIV Antigen/Antibody Combo    Syphilis Screen            Frances Hernandez is a 47 y.o. female [phone] who presents for annual exam.     > Mammography: biannually    > Patient Education: folate supplementation for NTD prophylaxis, domestic violence / safety at home, safe sex and condom use, STD prevention, HIV risk factors and prevention, and pap smear screening/results    Return prn or in 1 yr       Subjective:      Frances Hernandez is a 47 y.o. female [phone] who presents for annual exam. No LMP recorded. (Menstrual status: Other). Periods are rare--takes a high dose oral progestin. No abnormal discharge. The patient is not incontinent of urine.  MMG in 5/24 neg.  Pap in 11/19 showed ASCUS/HR-HPV neg.   Currently SA--new partner.  C/O a strong odor w/urination.  No dysuria.  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            Health Maintenance  > Contraceptive methods: condoms and tubal ligation.  > History of abnormal mammogram: no  > Family history of uterine or ovarian cancer: yes--mother  > Family history of breast cancer: yes-mother  > Domestic Violence History: no  > Dietary Supplements: Folate: no;  Calcium: no; Vitamin D: no  > Seat Belt Use: yes    The following portions of the patient's history were reviewed and updated as appropriate: allergies, current medications, past family history, past medical history, past social history, past surgical history, and problem list.      Review Of Systems   Constitutional: negative for fatigue and weight loss  Respiratory: negative for cough and wheezing  Cardiovascular: negative for chest pain, fatigue and palpitations  Gastrointestinal: negative for abdominal pain and change in bowel habits  Genitourinary:see above  Integument/breast: negative for nipple discharge  Musculoskeletal:negative for myalgias  Neurological: negative for gait problems and tremors  Behavioral/Psych: negative for abusive relationship, depression  Endocrine: negative for temperature intolerance          Objective:      BP 122/75  - Pulse 97  - Wt 92.5 kg (204 lb)  - BMI 35.02 kg/m??    The sensitive parts of the examination were performed with a chaperone.  Constitutional: Well-developed, well-nourished female in no acute distress  Neurological: Alert and oriented to person, place, and time  Psychiatric: Mood and affect appropriate  Skin: No rashes or lesions  Neck: Supple without masses. Trachea is midline.Thyroid is normal size without masses  Lymphatics: No cervical, axillary, supraclavicular,  or inguinal adenopathy noted  Respiratory: Clear to auscultation bilaterally. Good air movement with normal work of breathing.  Cardiovascular: Regular rate and rhythm. Extremities grossly normal, nontender with no edema; pulses regular  Gastrointestinal: Soft, nontender, nondistended. No masses or hernias appreciated. No hepatosplenomegaly. No fluid wave. No rebound or guarding.  Breast Exam: No tenderness, masses, or nipple abnormality  Genitourinary:          External Genitalia: Normal female genitalia     Urethral Meatus: Normal caliber and position     Urethra: Midline, no masses     Bladder: Well-suspended, mildly tender     Vagina: Well-rugated, no lesions. Thin, yellow discharge     Cervix: No lesions, normal size and consistency; no cervical motion tenderness; friable      Uterus: Normal size and contour; smooth, mobile, NT  Adnexa/Parametria: No masses; no parametrial nodularity; no tenderness  Perineum/Anus: No lesions

## 2023-01-07 LAB — HIV ANTIGEN/ANTIBODY COMBO: HIV ANTIGEN/ANTIBODY COMBO: NONREACTIVE

## 2023-01-07 LAB — SYPHILIS SCREEN: SYPHILIS RPR SCREEN: NONREACTIVE

## 2023-01-09 DIAGNOSIS — N3000 Acute cystitis without hematuria: Principal | ICD-10-CM

## 2023-01-09 MED ORDER — SULFAMETHOXAZOLE 800 MG-TRIMETHOPRIM 160 MG TABLET
ORAL_TABLET | Freq: Two times a day (BID) | ORAL | 0 refills | 3.00000 days | Status: CP
Start: 2023-01-09 — End: 2023-01-09

## 2023-01-09 NOTE — Unmapped (Signed)
Her urine C&S was pos for E.coli.  I'm sending a Rx to the pharmacy for Bactrim DS.

## 2023-01-09 NOTE — Unmapped (Signed)
Pt given message from provider. V/U. Prescription changed to preferred pharmacy.

## 2023-01-21 ENCOUNTER — Ambulatory Visit
Admit: 2023-01-21 | Discharge: 2023-01-22 | Payer: PRIVATE HEALTH INSURANCE | Attending: Student in an Organized Health Care Education/Training Program | Primary: Student in an Organized Health Care Education/Training Program

## 2023-01-21 DIAGNOSIS — H8110 Benign paroxysmal vertigo, unspecified ear: Principal | ICD-10-CM

## 2023-01-21 MED ORDER — CLONAZEPAM 0.5 MG TABLET
0 refills | 0 days | Status: CP
Start: 2023-01-21 — End: ?

## 2023-01-21 MED ADMIN — ondansetron (ZOFRAN-ODT) disintegrating tablet 4 mg: 4 mg | ORAL | @ 13:00:00 | Stop: 2023-01-21

## 2023-01-21 MED ADMIN — diphenhydrAMINE (BENADRYL) capsule/tablet 25 mg: 25 mg | ORAL | @ 13:00:00 | Stop: 2023-01-21

## 2023-01-21 NOTE — Unmapped (Signed)
Assessment:      Benign positional vertigo . Similar to prior episodes that tend to last a few days. Usually the maneuvers and meclizine help, but have not been helpful thus far, going on 2 days. Tried Epley x3 w/o much improvement.     Plan:     - trial benadryl and zofran here  - sent 3 days of klonopin q8 prn  - continue home maneuvers as frequently as possible    Subjective:       Frances Hernandez is a 47 y.o. female who presents for evaluation of dizziness. The symptoms started 2 days ago and are stable. The attacks occur frequently and last a few seconds. Positions that worsen symptoms: any motion, rolling in bed to the left, and turning head. Worse going to the left.  Associated ear symptoms: none. Associated CNS symptoms:  a little loss of balance in setting of vertigo, otherwise none . Recent infections:  recently treated for uti . No recent ear or uri infections. Head trauma: denied. Drug ingestion: none. Noise exposure: no occupational exposure. Family history: non-contributory.    The following portions of the patient's history were reviewed and updated as appropriate: allergies, current medications, past family history, past medical history, past social history, past surgical history, and problem list.    Review of Systems  A 12 point review of systems was negative except for pertinent items noted in the HPI.      Objective:      BP 144/91 (BP Site: L Arm, BP Position: Sitting, BP Cuff Size: Large)  - Pulse 65  - Temp 36.8 ??C (98.2 ??F) (Temporal)  - Ht 154.9 cm (5' 1)  - Wt 92.2 kg (203 lb 3.2 oz)  - LMP  (LMP Unknown)  - SpO2 100%  - BMI 38.39 kg/m??   Head: Normocephalic, without obvious abnormality, atraumatic  Eyes: conjunctivae/corneas clear. PERRL, EOM's intact. Fundi benign.  Ears: normal TM's and external ear canals both ears  Extremities: extremities normal, atraumatic, no cyanosis or edema  Skin: Skin color, texture, turgor normal. No rashes or lesions  Neurologic: Alert and oriented X 3, normal strength and tone. Normal symmetric reflexes. Normal coordination and gait Positive dix hallpike, worse to the left. Neg Hints

## 2023-01-21 NOTE — Unmapped (Signed)
Sent clonazepam to pharmacy. Try to avoid use of meclizine or other antihistamines while taking this medication as it will likely be too sedating in combination.

## 2023-01-28 NOTE — Unmapped (Signed)
E-Visit  This medical encounter was conducted virtually using [email or web address]  [email or web address] protocols.      Patient is currently located in the state of [place].  I have identified myself to the patient and conveyed my credentials to Frances Hernandez  The capabilities and limitations of telemedicine have been explained to the patient and both agree that it is appropriate for their current circumstances/symptoms.  Patient has signed informed consent on file in medical record.      Subjective:      Frances Hernandez is a 47 y.o. female who presents  for a e-visit with   Chief Complaint   Patient presents with    Headache     Entered automatically based on patient selection in My [facility] Chart.       See E-Visit Questionnaire answers.    HISTORY:  I have reviewed the patient's problem list, current medications, and allergies and have updated/reconciled them as needed.    Assessment and Plan:     Assessment & Plan  Migraine without aura and without status migrainosus, not intractable  Has tried Topamax, clonazepam, sumatriptan, and meclizine without improvement. Recommend in clinic appointment..             Follow up  with pcp, Straub, Frances Coil, MD as needed.     Visit conducted via E-Visit.    Total time spent on this E-Visit:5 min

## 2023-01-28 NOTE — Unmapped (Addendum)
Has tried Topamax, clonazepam, sumatriptan, and meclizine without improvement. Recommend in clinic appointment.Marland Kitchen

## 2023-01-30 DIAGNOSIS — E669 Obesity, unspecified: Principal | ICD-10-CM

## 2023-01-30 DIAGNOSIS — Z9884 Bariatric surgery status: Principal | ICD-10-CM

## 2023-01-31 MED FILL — TOPIRAMATE 100 MG TABLET: ORAL | 90 days supply | Qty: 90 | Fill #1

## 2023-01-31 MED FILL — WEGOVY 1.7 MG/0.75 ML SUBCUTANEOUS PEN INJECTOR: SUBCUTANEOUS | 28 days supply | Qty: 3 | Fill #0

## 2023-02-05 ENCOUNTER — Ambulatory Visit: Admit: 2023-02-05 | Discharge: 2023-02-06 | Payer: PRIVATE HEALTH INSURANCE

## 2023-02-05 DIAGNOSIS — E669 Obesity, unspecified: Principal | ICD-10-CM

## 2023-02-05 DIAGNOSIS — Z9884 Bariatric surgery status: Principal | ICD-10-CM

## 2023-02-05 NOTE — Unmapped (Signed)
[facility] Visit  Frances Hernandez is a 47 y.o. 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 No signs of DVT (no pain in calf, no leg swelling) and no risk factors for clot (no recent immobilization or surgery, no estrogen use (POPs only), no h/o DVT or hypercoaguable state, no h/o autoimmune disease). We agreed to check coags, autoimmune labs today to look for underlying cause for presumed livedo reticularis. Also refer to derm for further evaluation and recommendations. Follow-up sooner if rash is spreading, if develops swelling in extremity, fevers, or other new symptoms.   Orders:    CBC; Future    Comprehensive Metabolic Panel; Future    Anti-Nuclear Antibody (ANA); Future    Cryoglobulin; Future    Cold Agglutinin Titer; Future    PT-INR; Future    aPTT; Future    Rheumatoid Factor, Quantitative; Future    Anti-neutrophilic Cytoplasmic Antibody (ANCA); Future    Lipid Panel; Future    Ambulatory referral to Dermatology; Future    Return in about 4 weeks (around 03/05/2023) for recheck rash and BP.    HEALTH MAINTENANCE ITEMS STILL DUE:  Health Maintenance Due   Topic Date Due    Hepatitis C Screen  Never done     I personally spent 25 minutes face-to-face and non-face-to-face in the care of this patient, which includes all pre, intra, and post visit time on the date of service.  All documented time was specific to the E/M visit and does not include any procedures that may have been performed.      Subjective:   HPI: Frances Hernandez is a 47 y.o. female being seen for:   Chief Complaint   Patient presents with    Foot Pain     Right foot with lateral hard darkened area   Right calf with new onset of mottling      #Rash  Noticed rash over the weekend Saturday while at the beach  Some discomfort tenderness underneath the area  No calf swelling  No previous blood clot  No known vasculitis or autoimmune disorder    I have reviewed the patient's problem list, current medications, allergies, medical history, family history, and social history and updated them as needed.    ROS: A 10 point review of systems was negative except as noted in the HPI.     Objective:     Vitals:    02/05/23 1636   BP: 132/103   Temp: 36.6 ??C (97.9 ??F)       BP Readings from Last 3 Encounters:   02/05/23 132/103   01/21/23 144/91   01/04/23 122/75     Wt Readings from Last 3 Encounters:   02/05/23 91.8 kg (202 lb 6.4 oz)   01/21/23 92.2 kg (203 lb 3.2 oz)   01/04/23 92.5 kg (204 lb)      Physical exam:  GENERAL: Well appearing, in no acute distress  Skin: lacelike hyperpigmentation on RLE just distal to knee  There is some TTP of area but no induration or warmth  Calf diameter is symmetric

## 2023-02-06 ENCOUNTER — Ambulatory Visit: Admit: 2023-02-06 | Discharge: 2023-02-06 | Payer: PRIVATE HEALTH INSURANCE

## 2023-02-06 DIAGNOSIS — R231 Pallor: Principal | ICD-10-CM

## 2023-02-06 LAB — PROTIME-INR
INR: 1.04
PROTIME: 11.6 s (ref 9.9–12.6)

## 2023-02-06 LAB — COMPREHENSIVE METABOLIC PANEL
ALBUMIN: 3.6 g/dL (ref 3.4–5.0)
ALKALINE PHOSPHATASE: 58 U/L (ref 46–116)
ALT (SGPT): 18 U/L (ref 14–59)
ANION GAP: 7 mmol/L (ref 7–15)
AST (SGOT): 15 U/L (ref 15–37)
BILIRUBIN TOTAL: 0.8 mg/dL (ref 0.2–1.0)
BLOOD UREA NITROGEN: 15 mg/dL (ref 7–18)
BUN / CREAT RATIO: 19
CALCIUM: 8.6 mg/dL (ref 8.5–10.1)
CHLORIDE: 105 mmol/L (ref 97–107)
CO2: 29.2 mmol/L (ref 21.0–32.0)
CREATININE: 0.81 mg/dL (ref 0.51–0.95)
EGFR CKD-EPI (2021) FEMALE: 90 mL/min/{1.73_m2} (ref >=60)
GLUCOSE RANDOM: 84 mg/dL (ref 70–179)
POTASSIUM: 3.8 mmol/L (ref 3.3–4.7)
PROTEIN TOTAL: 7.4 g/dL (ref 6.4–8.2)
SODIUM: 141 mmol/L (ref 135–145)

## 2023-02-06 LAB — CBC
HEMATOCRIT: 38.5 % (ref 34.0–44.0)
HEMOGLOBIN: 12.6 g/dL (ref 11.3–14.9)
MEAN CORPUSCULAR HEMOGLOBIN CONC: 32.7 g/dL (ref 32.0–36.0)
MEAN CORPUSCULAR HEMOGLOBIN: 28.6 pg (ref 25.9–32.4)
MEAN CORPUSCULAR VOLUME: 87.3 fL (ref 77.6–95.7)
MEAN PLATELET VOLUME: 8.8 fL (ref 6.8–10.7)
PLATELET COUNT: 212 10*9/L (ref 150–450)
RED BLOOD CELL COUNT: 4.41 10*12/L (ref 3.95–5.13)
RED CELL DISTRIBUTION WIDTH: 13.5 % (ref 12.2–15.2)
WBC ADJUSTED: 6.7 10*9/L (ref 3.6–11.2)

## 2023-02-06 LAB — LIPID PANEL
CHOLESTEROL/HDL RATIO SCREEN: 3
CHOLESTEROL: 161 mg/dL (ref <=200)
HDL CHOLESTEROL: 54 mg/dL (ref 40–60)
LDL CHOLESTEROL CALCULATED: 87 mg/dL (ref 40–99)
NON-HDL CHOLESTEROL: 107 mg/dL
TRIGLYCERIDES: 98 mg/dL (ref 0–150)
VLDL CHOLESTEROL CAL: 19.6 mg/dL

## 2023-02-06 LAB — RHEUMATOID FACTOR, QUANT: RHEUMATOID FACTOR: 10.1 [IU]/mL (ref <14.0)

## 2023-02-06 LAB — APTT
APTT: 33 s (ref 24.8–38.4)
HEPARIN CORRELATION: 0.2

## 2023-02-07 LAB — ANTI-NEUTROPHILIC CYTOPLASMIC ANTIBODY
ANCA IFA: POSITIVE — AB
MPO-ELISA: POSITIVE — AB
MPO-QUANT: 24.9 U/mL — ABNORMAL HIGH (ref <21.0)
PR3 ELISA: NEGATIVE
PR3-QUANT: 3.6 U/mL (ref <21.0)

## 2023-02-07 LAB — ANA: ANTINUCLEAR ANTIBODIES (ANA): NEGATIVE

## 2023-02-08 DIAGNOSIS — R231 Pallor: Principal | ICD-10-CM

## 2023-02-08 NOTE — Unmapped (Signed)
Addended byAlvester Morin on: 02/08/2023 04:48 PM     Modules accepted: Orders

## 2023-02-09 LAB — COLD AGGLUTININ SCREEN: COLD AGGLUTININ TITER: <2 {titer}

## 2023-02-14 MED FILL — PHENTERMINE 37.5 MG TABLET: 30 days supply | Qty: 30 | Fill #1

## 2023-02-18 ENCOUNTER — Ambulatory Visit: Admit: 2023-02-18 | Discharge: 2023-02-19 | Payer: PRIVATE HEALTH INSURANCE

## 2023-02-18 ENCOUNTER — Ambulatory Visit
Admit: 2023-02-18 | Discharge: 2023-02-19 | Payer: PRIVATE HEALTH INSURANCE | Attending: Student in an Organized Health Care Education/Training Program | Primary: Student in an Organized Health Care Education/Training Program

## 2023-02-18 DIAGNOSIS — M25561 Pain in right knee: Principal | ICD-10-CM

## 2023-02-18 NOTE — Unmapped (Signed)
Pain after falling this morning. Pain is isolated to patellar. Based on [place] rules and SDM with patient, will obtain xray with sunrise view to rule out patellar fractures. Otherwise can use OTC medications for pain relief, compression, elevation, heat/ice, and follow up prn

## 2023-02-18 NOTE — Unmapped (Signed)
Assessment and Plan:       Anterior knee pain, right  Assessment & Plan:  Pain after falling this morning. Pain is isolated to patellar. Based on [place] rules and SDM with patient, will obtain xray with sunrise view to rule out patellar fractures. Otherwise can use OTC medications for pain relief, compression, elevation, heat/ice, and follow up prn     Orders:  -     XR Knee 4 Or More Views Right; Future         Return if symptoms worsen or fail to improve.    Subjective:     HPI: Frances Hernandez is a 47 y.o. female here for Follow-up (Swollen right knee after falling on it)    Here for right knee pain after falling  Was in kitchen, tripped over jump rope on floor, fell and hit kneecap on floor. Laminated floor.   Limping but able to bear weight   Pain is worse now than since her injury     Isolated right patellar tenderness w/o other pain  Xray     All review of symptoms negative unless noted in HPI.    Objective:     Vitals:    02/18/23 0819   BP: 132/80   BP Site: L Arm   BP Position: Sitting   BP Cuff Size: Large   Pulse: 87   Resp: 18   Temp: 36.3 ??C (97.3 ??F)   TempSrc: Tympanic   SpO2: 100%   Weight: 90.6 kg (199 lb 12.8 oz)   Height: 154.9 cm (5' 1)     Body mass index is 37.75 kg/m??.    Physical Exam:  General: Well developed. Well nourished. In no acute distress.  HEENT:  Normocephalic.  Atraumatic.   Heart:  Regular rate and rhythm   Lungs:  No respiratory distress.    Musculoskeletal: TTP along right patella. No other tenderness along joint line or surrounding anatomy. Mild effusion of right knee  Skin:  Warm, dry, no rashes  Neuro:  Non-focal. Gait antalgic.    Psych:  Affect normal, answers questions appropriately, eye contact good, speech clear and coherent.      Current Medications:     Current Outpatient Medications   Medication Sig Dispense Refill    albuterol HFA 90 mcg/actuation inhaler Inhale 2 puffs every four (4) hours as needed for wheezing or shortness of breath. 8.5 g 11 clonazePAM (KLONOPIN) 0.5 MG tablet Take .5 (.25 mg) to 1 tablet (.5 mg) as needed every 8 hours for vertigo 9 tablet 0    ergocalciferol-1,250 mcg, 50,000 unit, (DRISDOL) 1,250 mcg (50,000 unit) capsule Take 1 capsule (1,250 mcg total) by mouth once a week. 12 capsule 1    fluticasone propionate (FLONASE) 50 mcg/actuation nasal spray Instill 2 sprays into each nostril daily. 16 g 11    meclizine (ANTIVERT) 25 mg tablet Take 1 tablet (25 mg total) by mouth Three (3) times a day as needed for dizziness or nausea. 30 tablet 1    metFORMIN (GLUCOPHAGE-XR) 500 MG 24 hr tablet Take 2 tablets (1,000 mg total) by mouth daily with evening meal. 180 tablet 2    montelukast (SINGULAIR) 10 mg tablet Take 1 tablet (10 mg total) by mouth nightly. 90 tablet 3    norethindrone (AYGESTIN) 5 mg tablet Take 1 tablet (5 mg total) by mouth daily. 90 tablet 2    phentermine (ADIPEX-P) 37.5 mg tablet Take half a tablet before breakfast for two weeks, then increase to  a full tablet daily 30 tablet 5    semaglutide, weight loss, (WEGOVY) 1.7 mg/0.75 mL injection pen Inject 0.75 mL (1.7 mg total) under the skin every seven (7) days. 3 mL 0    topiramate (TOPAMAX) 100 MG tablet Take 1 tablet (100 mg total) by mouth nightly. 90 tablet 1    tranexamic acid 650 mg Tab tablet TAKE 2 TABLETS (1,300 MG TOTAL) BY MOUTH THREE (3) TIMES A DAY FOR 5 DAYS.      ondansetron (ZOFRAN) 4 MG tablet Take 1 tablet (4 mg total) by mouth every eight (8) hours as needed for nausea. (Patient not taking: Reported on 02/18/2023) 20 tablet 0    semaglutide, weight loss, (WEGOVY) 0.5 mg/0.5 mL injection pen Inject 0.5 mg under the skin every seven (7) days. (Patient not taking: Reported on 02/18/2023) 2 mL 0    semaglutide, weight loss, (WEGOVY) 1 mg/0.5 mL injection pen Inject 1 mg under the skin every seven (7) days. (Patient not taking: Reported on 02/18/2023) 2 mL 0    semaglutide, weight loss, (WEGOVY) 2.4 mg/0.75 mL injection pen Inject 2.4 mg under the skin every seven (7) days. 3 mL 0    vit B complx-folic ac-C-biotin 1 mg-100 mg- 300 mcg Tab  (Patient not taking: Reported on 02/18/2023)       No current facility-administered medications for this visit.     Glade Nurse, MD      Note - This record has been created using [employer]. Chart creation errors have been sought, but may not always have been located. Such creation errors do not reflect on the standard of medical care.

## 2023-02-27 ENCOUNTER — Ambulatory Visit
Admit: 2023-02-27 | Discharge: 2023-02-28 | Disposition: A | Discharge status: Home / Self Care | Payer: PRIVATE HEALTH INSURANCE

## 2023-02-27 ENCOUNTER — Emergency Department
Admit: 2023-02-27 | Discharge: 2023-02-28 | Disposition: A | Discharge status: Home / Self Care | Payer: PRIVATE HEALTH INSURANCE

## 2023-02-27 DIAGNOSIS — M79605 Pain in left leg: Principal | ICD-10-CM

## 2023-02-27 DIAGNOSIS — S39012A Strain of muscle, fascia and tendon of lower back, initial encounter: Principal | ICD-10-CM

## 2023-02-27 DIAGNOSIS — S161XXA Strain of muscle, fascia and tendon at neck level, initial encounter: Principal | ICD-10-CM

## 2023-02-27 DIAGNOSIS — G43009 Migraine without aura, not intractable, without status migrainosus: Principal | ICD-10-CM

## 2023-02-27 MED ORDER — METHOCARBAMOL 500 MG TABLET
ORAL_TABLET | Freq: Four times a day (QID) | ORAL | 0 refills | 10 days | Status: CP
Start: 2023-02-27 — End: 2023-03-09

## 2023-02-27 MED ORDER — DICLOFENAC 1 % TOPICAL GEL
Freq: Four times a day (QID) | TOPICAL | 0 refills | 44 days | Status: CP
Start: 2023-02-27 — End: 2024-02-27

## 2023-02-28 MED ADMIN — acetaminophen (TYLENOL) tablet 650 mg: 650 mg | ORAL | Stop: 2023-02-27
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
## 2023-03-11 ENCOUNTER — Ambulatory Visit
Admit: 2023-03-11 | Discharge: 2023-03-11 | Payer: PRIVATE HEALTH INSURANCE | Attending: Internal Medicine | Primary: Internal Medicine

## 2023-03-11 ENCOUNTER — Ambulatory Visit: Admit: 2023-03-11 | Discharge: 2023-03-11 | Payer: PRIVATE HEALTH INSURANCE

## 2023-03-11 DIAGNOSIS — R232 Flushing: Principal | ICD-10-CM

## 2023-03-11 DIAGNOSIS — E66811 Class 1 obesity due to excess calories without serious comorbidity with body mass index (BMI) of 32.0 to 32.9 in adult: Principal | ICD-10-CM

## 2023-03-11 DIAGNOSIS — Z6832 Body mass index (BMI) 32.0-32.9, adult: Principal | ICD-10-CM

## 2023-03-11 DIAGNOSIS — E559 Vitamin D deficiency, unspecified: Principal | ICD-10-CM

## 2023-03-11 DIAGNOSIS — E6609 Other obesity due to excess calories: Principal | ICD-10-CM

## 2023-03-11 DIAGNOSIS — Z9884 Bariatric surgery status: Principal | ICD-10-CM

## 2023-03-11 DIAGNOSIS — Z6831 Body mass index (BMI) 31.0-31.9, adult: Principal | ICD-10-CM

## 2023-03-11 DIAGNOSIS — E66812 Class II obesity: Principal | ICD-10-CM

## 2023-03-11 DIAGNOSIS — R112 Nausea with vomiting, unspecified: Principal | ICD-10-CM

## 2023-03-11 LAB — ESTRADIOL(ESTROGEN) LEVEL: ESTRADIOL LEVEL: 37.5 pg/mL

## 2023-03-11 LAB — FOLLICLE STIMULATING HORMONE: FOLLICLE STIMULATING HORMONE: 77.8 m[IU]/mL

## 2023-03-11 LAB — TSH: THYROID STIMULATING HORMONE: 1.496 u[IU]/mL (ref 0.550–4.780)

## 2023-03-11 MED ORDER — SEMAGLUTIDE (WEIGHT LOSS) 2.4 MG/0.75 ML SUBCUTANEOUS PEN INJECTOR
SUBCUTANEOUS | 3 refills | 84 days | Status: CP
Start: 2023-03-11 — End: ?
  Filled 2023-03-28: qty 3, 28d supply, fill #0

## 2023-03-11 MED ORDER — ONDANSETRON 4 MG DISINTEGRATING TABLET
ORAL_TABLET | Freq: Three times a day (TID) | 2 refills | 7 days | Status: CP | PRN
Start: 2023-03-11 — End: ?

## 2023-03-11 MED ORDER — TOPIRAMATE 100 MG TABLET
ORAL_TABLET | Freq: Every evening | ORAL | 3 refills | 90 days | Status: CP
Start: 2023-03-11 — End: ?

## 2023-03-11 NOTE — Unmapped (Signed)
[facility] - [facility] Visit  Jilliann Nadja Lina is a 47 y.o. female being seen today for:    Assessment & Plan  Hot flashes  Sadeen presents with one month of multiple symptoms including hot flashes, night sweats, anxiety, heart palpitations, dizziness, insomnia, and irritability. Her symptoms are most concerning for perimenopausal hormonal fluctuations. Her physical exam is normal today and reassuring. Her LMP was 2 months ago, though she is on continuous norethindrone at this time for h/o AUB. We agreed to start today by checking FSH, estradiol, and TSH. She has recent normal CBC. If levels suggest perimenopause or menopause, can discuss treatment options.   Orders:    Follicle Stimulating Hormone Level [facility]); Future    TSH; Future    Estradiol (Estrogen) Level; Future    HEALTH MAINTENANCE ITEMS STILL DUE:  Health Maintenance Due   Topic Date Due    Hepatitis C Screen  Never done    COVID-19 Vaccine (5 - 2024-25 season) 02/03/2023     I personally spent 27 minutes face-to-face and non-face-to-face in the care of this patient, which includes all pre, intra, and post visit time on the date of service.  All documented time was specific to the E/M visit and does not include any procedures that may have been performed.      Subjective:   HPI: Kuulei Kleier is a 47 y.o. female being seen for:   Chief Complaint   Patient presents with    Follow-up     #Anxiety and mult sx  Thinks she is having anxiety attacks; feels jittery, shaky, heart beats really fast, doesn't sleep, sweats, frequent headaches at work, difficult to relax once gets off work  Has tried ginger tea, melatonin, not helping  Feels like it started around first week of September  Doesn't take a lot to make her feel on edge or overwhelmed  She is having hot flashes during the day; sweats all night  Also tossing and turning; now has to sleep by herself  Currently on norethindrone daily; last had bleeding 2 months ago  Sometimes with chest tightness or shortness of breath when feels anxious  Having hot flashes out of the blue where has to strip off layers of clothing  Also having some new dizzy spells that are different than her vertigo, just feels light-headed during these    I have reviewed the patient's problem list, current medications, allergies, medical history, family history, and social history and updated them as needed.    ROS: A 10 point review of systems was negative except as noted in the HPI.     Objective:     Vitals:    03/11/23 1619   BP: 119/80   Pulse: 74   Temp: 36.7 ??C (98.1 ??F)     BP Readings from Last 3 Encounters:   03/11/23 119/80   03/11/23 124/89   02/27/23 134/79     Wt Readings from Last 3 Encounters:   03/11/23 90.3 kg (199 lb)   03/11/23 90.3 kg (199 lb)   02/27/23 89.8 kg (198 lb)     Physical exam:  GENERAL: Well appearing, in no acute distress  HEENT: normocephalic/ atraumatic, EOMI, MMM  CARDIO: RRR, no murmur appreciated  PULM: normal WOB on room air, CTAB  MSK: normal gait  Extremities: no edema  NEURO: No focal neurologic deficits, face symmetric, no movement disorder noted  PSYCH: full affect, normal fluent speech

## 2023-03-11 NOTE — Unmapped (Signed)
[facility] [place]  [address]  [place] [address]  Phone: [phone]  Fax: [phone]      Weight Management Clinic Visit    - prior patient of NP Onime  - 05/02/22 first appointment with Dr. Bronson Curb       HISTORY:    Summary at initial presentation  Frances Hernandez is a 47 y.o. female and There is no height or weight on file to calculate BMI. had   ?s/p vertical sleeve gastrectomy by Dr. Leona Carry at [facility] on 2016. Marland Kitchen  She has presented with initial weight loss of 32 % at nadir followed by weight regain of 46% . The BMI prior to surgery was 56 and a nadir BMI after surgery was 32.   Present co-morbidities include n/a.   Her primary issue at this time is weight regain.     Treatment course  Starting weight at presentation: 232 lbs   Weight today: 189 lb  Total weight loss since presentation: 52 lbs and regained 13 lb  Percent weight loss since presentation: 22%   Percent weight loss with lifestyle/liraglutide: n/a   Percent weight loss with lifestyle/metformin: n/a   Percent weight loss with lifestyle/semaglutide: 22%     08/29/22 Weight 208 lbs  11/2022  Weight: 202 lbs   03/11/23 Weight: 199 lbs, BMI 36 kg/m2       -   Weight at home - since bring back on Wegovy - has lost from 206 to 197 lbs on home scale - about 9 lbs          Previous use of anti-obesity medications:  Phentermine: yes  12 lb loss over 3 months and side effects: none  Metformin: no  Topiramate: no  Bupropion: no  Liraglutide: Yes 02/2019   Semaglutide: Yes -- tolerating it well   Tirzepatide: no   SGLT-2 inhibitor: no  Orlistat :no  Other agents:    Obesity ROS  Glaucoma: no  Palpitations: no  Chest Pain: no  Headaches: yes, 2-3 per month last 30 mins to few hrs  Nephrolithiasis: no  Seizures: no  H/o pancreatitis: no  Personal or family history of medullary cancer of thyroid:no     Musculoskeletal pain yes, occassional shoulder muscle spasms  Urinary incontinence no  Heartburn no   Abdominal pain no     Narrative history  ??Denies any side effect such as N/D/V/C.    Reports good appetite suppression and early satiety  Doing very well with Wegovy. Had stopped taking Lyrica -weight gain.   Concern about current weight regained.   Taking a women's one a day vit with biotin gummie.     Diet:    3 small meals per day with protein. Sandwich. Veggies and Fruit daily.   Started drinking sweet tea at work  Not consistent with appropriate protein per meal    ?Exercise: walking and yoga 30 min x3 times weekly.     Sleep: sleep is pretty good   ??  Stress: higher related to home issues but managble.   ???  Medications:    Eating disorders:      05/02/22 CLINIC APPOINTMENT    - s/p sleeve gastrectomy with Dr. Ferd Glassing in 2016  - Current BMI  31.18 kg/m2     -She reports doing well with the below regimen.  She denies nausea, vomiting, abdominal pain, diarrhea and constipation on Wegovy and metformin.  -Denies problems with topiramate 50 mg daily (no paresthesias, dysgeusia,  or brain fog) but notes that she has been taking this for migraines.  She had a pressure implant applied in her left ear which is helping significantly with her migraines and she reduced her topiramate to 150 mg tablet once a week.     Current Meds;  - metformin XR 1000 mg daily   - topiramate 50 mg daily  - Wegovy 2.4 mg once a week     Post bariatric nutrition supplements:  -Women's One a Day multivitamin-1 pill once a day  -Recommended to take vitamin D supplement but has not yet started 1      Treatment course  Starting weight at presentation to [facility]: 232 lbs   Weight today 176 lbs  from 189 lb    Total weight loss since presentation:       - 56 lbs   Percent weight loss since presentation:   24.1 %     Percent weight loss with lifestyle/liraglutide: n/a   Percent weight loss with lifestyle/metformin: n/a   Percent weight loss with lifestyle/semaglutide: 24.1%     -Reviewed her recent labs which are notable for vitamin D deficiency.  She was instructed to take a supplement by her PCP but has not yet obtained 1.    -We also discussed her low folate which I believe was checked due to concern for mild anemia in the past.  She has had a normal vitamin B12 level.    -Last post bariatric nutrition labs were in November 2022    -She had an asthma flare in the last 2 weeks for which she was placed on amoxicillin and a prednisone short course of 20 mg daily for 5 days.  She has stopped this but she did have an elevated blood pressure on intake today although she does not have a prior diagnosis of hypertension and notes her blood pressure is usually normal.    -Repeated blood pressure with a manual cuff and she was 134/85.  We discussed continuing to monitor her blood pressure.    08/29/22 CLINIC APPOINTMENT    - pt well appearing but has had sig weight gain off of Wegovy     - loss of insurance coverage     => insurance coverage to change next week       Current Meds;  - metformin XR 1000 mg daily   - topiramate 50 mg daily    - Wegovy 2.4 mg once a week   => no longer provided     Post bariatric nutrition supplements:  -Women's One a Day multivitamin-1 pill once a day  -Recommended to take vitamin D supplement but has not yet started 1      Treatment course  Starting weight at presentation to [facility]: 232 lbs   Weight today 176 lbs  from 189 lb  Weight today 08/29/22:  208 lbs, BMI 35.8 kg/m2  => weight gain off of Wegovy     Total weight loss since presentation:       - 56 lbs   Percent weight loss since presentation:   24.1 %     Percent weight loss with lifestyle/liraglutide: n/a   Percent weight loss with lifestyle/metformin: n/a   Percent weight loss with lifestyle/semaglutide: 24.1%     11/21/22 VIDEO APPOINTMENT    - pt had been off of Wegovy for about 4 months  - was off due to supply problems    - she restarted at 2.4 mg dose when available and experienced nausea  and vomiting     - has now restarted lowest dose and tolerating without a problem    Current Meds;  - metformin XR 1000 mg daily   - topiramate 100 mg daily  - Wegovy 0.25 mg once a week  => restarted last week     Post bariatric nutrition supplements:  -Women's One a Day multivitamin-1 pill once a day  -Ergocalciferol once a week       Treatment course  Starting weight at presentation to [facility]: 232 lbs   Weight today 176 lbs  from 189 lb  Weight today 08/29/22:  208 lbs, BMI 35.8 kg/m2  => weight gain off of [facility]     11/01/22 208 Weight 208 lbs      Change:  0 lbs    11/21/22 203 lbs      Change: - 5 lbs    Total weight loss since presentation:       - 56 lbs   Percent weight loss since presentation:   24.1 %     Percent weight loss with lifestyle/liraglutide: n/a   Percent weight loss with lifestyle/metformin: n/a   Percent weight loss with lifestyle/semaglutide: 24.1%     03/11/23 CLINIC APPOINTMENT    - pt well appearing  - she restarted Wegoyv and tolerated well until she got to the highest dose again - first dose of Wegovy on Saturday, had episode of vomiting (fluid, not food material) yesterday (first episode); she took a dose of antivert which she takes for vertigo in the last 6 months to see if it would help with nausea, no further episodes    Diet:   - avoiding carbs - bread, pasta, rice   - hunger decreased    Physical Activity   - reaching 10,000 steps daily (monitors with FitBit)  - walks with husband who habitually hits 30,000 steps  => discussed adding resistance training     Current Meds:  - metformin XR 1000 mg daily => tolerating  - topiramate 100 mg daily  => taking nightly, no side effects   - Wegovy 2.4 mg once a week  => first dose Saturday, 03/09/23   - phentermine 37.5 mg daily => taking every other day     Post bariatric nutrition supplements:  -Women's One a Day multivitamin => 1 pill once a day  -Ergocalciferol once a week  => did not start in June as she recalled having a high vitamin D at one point in the past     Treatment course  Starting weight at presentation to [facility]: 232 lbs Weight today 176 lbs  from 189 lb  Weight today 08/29/22:  208 lbs, BMI 35.8 kg/m2  => weight gain off of [facility]     11/01/22 208 Weight 208 lbs      Change:  0 lbs    11/21/22 203 lbs      Change: - 5 lbs  03/11/23 Weight: 199 lbs (has lost about 9 lbs by home scales since back on Wegovy)     Total weight loss since presentation:       - 56 lbs   Percent weight loss since presentation:   24.1 %     Percent weight loss with lifestyle/liraglutide: n/a   Percent weight loss with lifestyle/metformin: n/a   Percent weight loss with lifestyle/semaglutide: 24.1%       Patient Active Problem List    Diagnosis Date Noted    Anterior knee pain, right 02/18/2023  Elevated BP without diagnosis of hypertension 08/09/2022    Well adult exam 04/10/2022    Heavy menstrual bleeding 07/14/2021    Intertrigo 03/31/2021    Lumbar paraspinal muscle spasm 04/14/2020    Chronic bilateral low back pain with right-sided sciatica 04/14/2020    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    Migraine without aura and without status migrainosus, not intractable 01/20/2020    Vertigo, benign positional 08/14/2019    Chronic endometritis 04/16/2018    Seasonal allergies 06/29/2017    Class 1 obesity due to excess calories without serious comorbidity with body mass index (BMI) of 32.0 to 32.9 in adult 04/10/2016    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 8.5 g 11    diclofenac sodium (VOLTAREN) 1 % gel Apply 2 g topically four (4) times a day. 350 g 0    ergocalciferol-1,250 mcg, 50,000 unit, (DRISDOL) 1,250 mcg (50,000 unit) capsule Take 1 capsule (1,250 mcg total) by mouth once a week. 12 capsule 1    fluticasone propionate (FLONASE) 50 mcg/actuation nasal spray Instill 2 sprays into each nostril daily. 16 g 11    meclizine (ANTIVERT) 25 mg tablet Take 1 tablet (25 mg total) by mouth Three (3) times a day as needed for dizziness or nausea. 30 tablet 1    metFORMIN (GLUCOPHAGE-XR) 500 MG 24 hr tablet Take 2 tablets (1,000 mg total) by mouth daily with evening meal. 180 tablet 2    montelukast (SINGULAIR) 10 mg tablet Take 1 tablet (10 mg total) by mouth nightly. 90 tablet 3    norethindrone (AYGESTIN) 5 mg tablet Take 1 tablet (5 mg total) by mouth daily. 90 tablet 2    ondansetron (ZOFRAN) 4 MG tablet Take 1 tablet (4 mg total) by mouth every eight (8) hours as needed for nausea. 20 tablet 0    phentermine (ADIPEX-P) 37.5 mg tablet Take half a tablet before breakfast for two weeks, then increase to a full tablet daily 30 tablet 5    tranexamic acid 650 mg Tab tablet TAKE 2 TABLETS (1,300 MG TOTAL) BY MOUTH THREE (3) TIMES A DAY FOR 5 DAYS.      triamcinolone (KENALOG) 0.1 % cream APPLY A THIN LAYER TO THE AFFECTED AREA UNTIL FLAT AND NO LONGER ITCHY FOR A MAX OF 2 WEEKS ON THE FACE AND 4 WEEKS ON HANDS AND FEET PER FLARE      vit B complx-folic ac-C-biotin 1 mg-100 mg- 300 mcg Tab       [EXPIRED] methocarbamol (ROBAXIN) 500 MG tablet Take 2 tablets (1,000 mg total) by mouth four (4) times a day for 10 days. 80 tablet 0     No current facility-administered medications on file prior to visit.       OBJECTIVE    Vital Signs     Vitals:    03/11/23 0835   BP: 124/89   Pulse: 74   Weight: 90.3 kg (199 lb)     Virtual appointment     Physical Exam:  Gen: well appearing on screen     Pscyh: appropriate affect     DATA REVIEW:    Component  Latest Ref Rng 02/06/2023   Sodium      135 - 145 mmol/L 141    Potassium      3.3 - 4.7 mmol/L 3.8    Chloride      97 - 107 mmol/L 105    CO2      21.0 - 32.0 mmol/L 29.2    Anion Gap      7 - 15 mmol/L 7    Bun      7 - 18 mg/dL 15    Creatinine      1.61 - 0.95 mg/dL 0.96    BUN/Creatinine Ratio 19    eGFR CKD-EPI (2021) Female      >=60 mL/min/1.20m2 90    Glucose      70 - 179 mg/dL 84    Calcium      8.5 - 10.1 mg/dL 8.6    Albumin      3.4 - 5.0 g/dL 3.6    Total Protein      6.4 - 8.2 g/dL 7.4    Total Bilirubin      0.2 - 1.0 mg/dL 0.8    SGOT (AST)      15 - 37 U/L 15    ALT      14 - 59 U/L 18    Alkaline Phosphatase      46 - 116 U/L 58    WBC      3.6 - 11.2 10*9/L 6.7    RBC      3.95 - 5.13 10*12/L 4.41    HGB      11.3 - 14.9 g/dL 04.5    HCT      40.9 - 44.0 % 38.5    MCV      77.6 - 95.7 fL 87.3    MCH      25.9 - 32.4 pg 28.6    MCHC      32.0 - 36.0 g/dL 81.1    RDW      91.4 - 15.2 % 13.5    MPV      6.8 - 10.7 fL 8.8    Platelet      150 - 450 10*9/L 212                04/2021 labs reviewed  Low folate, Transferrin     Lab Results   Component Value Date    Hemoglobin A1C 4.6 (L) 04/10/2022    Hemoglobin A1C 4.5 (L) 05/03/2021    Hemoglobin A1C 5.0 11/03/2019    TSH 1.848 05/03/2021    ALT 18 02/06/2023    ALT 22 08/07/2022    ALT 8 (L) 05/02/2022    AST 15 02/06/2023    AST 23 08/07/2022    AST 13 05/02/2022    Creatinine 0.81 02/06/2023    Creatinine 0.70 09/27/2022    Creatinine 0.73 08/07/2022     Component      Latest Ref Rng 05/03/2021   WBC      3.6 - 11.2 10*9/L 5.7    RBC      3.95 - 5.13 10*12/L 4.11    HGB      11.3 - 14.9 g/dL 78.2    HCT      95.6 - 44.0 % 35.9    MCV      77.6 - 95.7 fL 87.2    MCH      25.9 - 32.4 pg 28.5    MCHC  32.0 - 36.0 g/dL 16.1    RDW      09.6 - 15.2 % 13.5    MPV      6.8 - 10.7 fL 9.3    Platelet      150 - 450 10*9/L 196        Component      Latest Ref Rng 05/03/2021 04/10/2022   Sodium      135 - 145 mmol/L  141    Potassium      3.4 - 4.8 mmol/L  4.0    Chloride      98 - 107 mmol/L  111 (H)    CO2      20.0 - 31.0 mmol/L  23.0    Anion Gap      5 - 14 mmol/L  7    Bun      9 - 23 mg/dL 14  12    Creatinine      0.55 - 1.02 mg/dL 0.45  4.09    BUN/Creatinine Ratio  16    eGFR CKD-EPI (2021) Female      >=60 mL/min/1.96m2 >90  >90    Glucose      70 - 99 mg/dL  81 Calcium      8.7 - 10.4 mg/dL 8.7  8.9    Albumin      3.4 - 5.0 g/dL  3.8    Total Protein      5.7 - 8.2 g/dL  7.2    Total Bilirubin      0.3 - 1.2 mg/dL  1.4 (H)    SGOT (AST)      <=34 U/L 15  15    ALT      10 - 49 U/L 8 (L)  9 (L)    Alkaline Phosphatase      46 - 116 U/L  39 (L)    Triglycerides      0 - 150 mg/dL  61    Cholesterol      <=200 mg/dL  811    HDL      40 - 60 mg/dL  48    LDL calculated      40 - 99 mg/dL  99    VLDL Cholesterol Cal      9 - 37 mg/dL  91.4    Chol/HDL Ratio      1.0 - 4.5   3.3    Non-HDL Cholesterol      70 - 782 mg/dL  956    FASTING  Yes    Iron      50 - 170 ug/dL 213     TIBC      086.5 - 425.0 ug/dL 784.6     Transferrin      250.0 - 380.0 mg/dL 962.9 (L)     Iron Saturation (%)      % 35     Hemoglobin A1c      4.8 - 5.6 % 4.5 (L)  4.6 (L)    Estimated Average Glucose      mg/dL 82  85    Vitamin E      5.5 - 17.0 mg/L 8.6     Vitamin C      0.4 - 2.0 mg/dL 0.4     Vitamin B6      5 - 50 mcg/L 8     Vitamin B1      70 - 180 nmol/L 103     Vitamin A  32.5 - 78.0 mcg/dL 24.4     Vitamin D Total (25OH)      20.0 - 80.0 ng/mL 23.6  19.2 (L)    Vitamin B-12      211 - 911 pg/ml 347     Ferritin      7.3 - 270.7 ng/mL 21.9     TSH      0.550 - 4.780 uIU/mL 1.848     Folate      >=5.4 ng/mL 4.5 (L)        Legend:  (L) Low  (H) High      ASSESSMENT AND PLAN:    03/11/23 Plan      # Obesity -- weight gain off of Weogvy (~30 lbs)   - 2016 Laparoscopic Sleeve Gastrectomy   - 05/02/22 BMI 31.89 kg/m2     Current Meds:  - Continue metformin XR 1000 mg daily   - Continue topiramate 100 mg daily  - Continue Wegovy 2.4 mg once a week due to length of time off of medication    [ ]   Taper off of phentemrine 37.5 mg daily => decrease to half a tab in the AM and then stop after 1-2 weeks      # S/p Vertical Sleeve Gastrectomy by Dr. Ferd Glassing in 2016  - Nov. 2023 labs reviewed     - continue vitamin support        - Women's One A Day     [  ]  ergocalciferol weekly - stopped in March, did not restart in June       # Vitamin D Deficiency -   -Start 12-week course of ergocalciferol followed by transition to daily cholecalciferol as instructed  = change to maintenance dose 50K every other week   => pt did not restart vitamin D supplement in June - states she recalled having too high a vit D level but that was in 2021    [  ] vitamin D level today, then re-prescribe ergocalciferol ot different vitamin D supplement     # Migraines  - taking topiramate 100 mg once a day, previously started for migraine prophylaxis.    - reduction in migraines with pin placement in left ear.    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    Relevant Medications    semaglutide, weight loss, (WEGOVY) 2.4 mg/0.75 mL injection pen    topiramate (TOPAMAX) 100 MG tablet    Class 1 obesity due to excess calories without serious comorbidity with body mass index (BMI) of 32.0 to 32.9 in adult    Relevant Medications    topiramate (TOPAMAX) 100 MG tablet     Other Visit Diagnoses       Nausea and vomiting, unspecified vomiting type    -  Primary  Relevant Medications    ondansetron (ZOFRAN-ODT) 4 MG disintegrating tablet    Class II obesity        Relevant Medications    semaglutide, weight loss, (WEGOVY) 2.4 mg/0.75 mL injection pen    BMI 31.0-31.9,adult        Relevant Medications    topiramate (TOPAMAX) 100 MG tablet          Patient Instructions       EXERCISE:    Exercise:   - target 15,000 steps daily     [email or web address]    - Resistance Training VIDEOS  - GOAL 20-30 minutes three times a week           FOLLOW UP PLAN:    Return in about 4 months (around 07/12/2023).      Visit Duration    I personally spent  minutes face-to-face and non-face-to-face in the care of this patient, which includes all pre, intra, and post visit time on the date of service.  Billing based on medical decision making.         Enid Baas Bronson Curb, MD   [facility]  [address]  [place] [address]  Phone: [phone]  Fax: [phone]       Note - This record has been created using Dragon software. Chart creation errors have been sought, but may not always have been located.   Such creation errors do not reflect on the standard of medical care.

## 2023-03-11 NOTE — Unmapped (Signed)
EXERCISE:    Exercise:   - target 15,000 steps daily     [email or web address]    - Resistance Training VIDEOS  - GOAL 20-30 minutes three times a week

## 2023-03-13 LAB — VITAMIN D 25 HYDROXY: VITAMIN D, TOTAL (25OH): 15.7 ng/mL — ABNORMAL LOW (ref 20.0–80.0)

## 2023-03-13 MED ORDER — VENLAFAXINE ER 37.5 MG CAPSULE,EXTENDED RELEASE 24 HR
ORAL_CAPSULE | Freq: Every day | ORAL | 2 refills | 30 days | Status: CP
Start: 2023-03-13 — End: 2024-03-12
  Filled 2023-03-28: qty 30, 30d supply, fill #0

## 2023-03-13 NOTE — Unmapped (Signed)
Called patient and reviewed her lab results.   Given her significant vasomotor symptoms and high FSH, most consistent with perimenopause. She is still on norethindrone so cannot determine if post-menopausal at this time.   Reviewed options of menopausal hormone therapy vs non-hormonal for her sx. She elects to try non-hormonal first.   Start venlafaxine 37.5 mg daily.   F/u 4 weeks to reassess. Can titrate up at that time if needed.   Also recommend trial off the norethindone to see if menses return or not. She is in agreement.

## 2023-03-14 ENCOUNTER — Ambulatory Visit: Admit: 2023-03-14 | Discharge: 2023-03-15 | Payer: PRIVATE HEALTH INSURANCE

## 2023-03-14 DIAGNOSIS — F411 Generalized anxiety disorder: Principal | ICD-10-CM

## 2023-03-14 DIAGNOSIS — F430 Acute stress reaction: Principal | ICD-10-CM

## 2023-03-14 MED ORDER — AMITRIPTYLINE 25 MG TABLET
ORAL_TABLET | Freq: Every evening | ORAL | 1 refills | 30 days | Status: CP
Start: 2023-03-14 — End: 2024-03-13

## 2023-03-14 MED ORDER — BUSPIRONE 5 MG TABLET
ORAL_TABLET | Freq: Three times a day (TID) | ORAL | 1 refills | 7 days | Status: CP | PRN
Start: 2023-03-14 — End: 2024-03-13

## 2023-03-14 MED ADMIN — ketorolac (TORADOL) injection 15 mg: 15 mg | INTRAMUSCULAR | @ 19:00:00 | Stop: 2023-03-14
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
## 2023-03-20 NOTE — Unmapped (Signed)
Frances Hernandez requested a refill of their [facility] via IVR/Web. [facility] has scheduled delivery per the patients request via Same Day Courier to be delivered to their prescription address on 03/21/23.

## 2023-03-28 DIAGNOSIS — Z9884 Bariatric surgery status: Principal | ICD-10-CM

## 2023-03-28 MED ORDER — ERGOCALCIFEROL (VITAMIN D2) 1,250 MCG (50,000 UNIT) CAPSULE
ORAL_CAPSULE | 1 refills | 0 days | Status: CP
Start: 2023-03-28 — End: ?
  Filled 2023-04-09: qty 12, 84d supply, fill #0

## 2023-03-29 ENCOUNTER — Ambulatory Visit
Admit: 2023-03-29 | Discharge: 2023-03-30 | Payer: PRIVATE HEALTH INSURANCE | Attending: Student in an Organized Health Care Education/Training Program | Primary: Student in an Organized Health Care Education/Training Program

## 2023-03-29 DIAGNOSIS — M25552 Pain in left hip: Principal | ICD-10-CM

## 2023-03-29 MED ORDER — TIZANIDINE 4 MG TABLET
ORAL_TABLET | Freq: Three times a day (TID) | ORAL | 1 refills | 20 days | Status: CP | PRN
Start: 2023-03-29 — End: 2024-03-28

## 2023-03-29 MED ADMIN — triamcinolone acetonide (KENALOG-40) injection 40 mg: 40 mg | INTRAMUSCULAR | @ 16:00:00 | Stop: 2023-03-29
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
## 2023-04-08 NOTE — Unmapped (Signed)
per Morton Peters patient req earlier appt time on 11-08 w/Dr Tamela Oddi called to offer 10:00am if still open on 11-08. lvm

## 2023-04-12 ENCOUNTER — Ambulatory Visit: Admit: 2023-04-12 | Discharge: 2023-04-13 | Payer: BLUE CROSS/BLUE SHIELD

## 2023-04-12 DIAGNOSIS — N760 Acute vaginitis: Principal | ICD-10-CM

## 2023-04-12 DIAGNOSIS — N939 Abnormal uterine and vaginal bleeding, unspecified: Principal | ICD-10-CM

## 2023-04-12 MED ORDER — NORETHINDRONE ACETATE 5 MG TABLET
ORAL_TABLET | Freq: Every day | ORAL | 3 refills | 90.00000 days | Status: CP
Start: 2023-04-12 — End: 2023-07-11

## 2023-04-12 MED ORDER — FLUCONAZOLE 150 MG TABLET
ORAL_TABLET | Freq: Once | ORAL | 0 refills | 1.00000 days | Status: CP
Start: 2023-04-12 — End: 2023-04-12

## 2023-04-13 NOTE — Unmapped (Signed)
VISIT SUMMARY:    During today's visit, we addressed your concerns about vaginal itching and discharge, groin itching, and hot flashes. We discussed your symptoms, examined the affected areas, and developed a treatment plan to help manage your conditions.    YOUR PLAN:    -VAGINAL YEAST INFECTION: A vaginal yeast infection is a fungal infection that causes irritation, discharge, and intense itchiness of the vagina and the vulva. We collected a sample for further testing and prescribed Diflucan (fluconazole) 150mg  as a single oral dose to treat the infection.    -CUTANEOUS YEAST INFECTION: A cutaneous yeast infection is a fungal infection of the skin, often causing itching and discomfort. You have been using Lotrimin with improvement, so continue using it for another week. After completing Lotrimin, you may use petroleum jelly to help with any remaining itching.    -MENOPAUSAL SYMPTOMS: Menopausal symptoms, such as hot flashes, occur due to hormonal changes in the body. We have reduced your Norethindrone dose to 2.5mg  and advised you on lifestyle modifications, such as wearing cotton clothing, avoiding known triggers, and using cool packs. We will reassess your symptoms in 6 months.    INSTRUCTIONS:    Please follow up in 6 months to reassess your menopausal symptoms. If your symptoms worsen or you have any concerns before then, do not hesitate to contact our office.

## 2023-04-13 NOTE — Unmapped (Signed)
Assessment & Plan  Vaginal Yeast Infection  Vaginal itching and yellowish discharge for 2-3 weeks. History of yeast infection in August. Physical exam shows brownish discharge and PH less than 4.5.  -Collect sample for wet mount.  -Prescribe Diflucan (fluconazole) 150mg  oral single dose empirically    Cutaneous Yeast Infection  Itching in the groin and under the pannus. Using Lotrimin for a week with improvement. Physical exam shows normal skin.  -Continue Lotrimin for another week.  -Consider petroleum jelly for itching after completing Lotrimin.    Menopausal Symptoms  Hot flashes for 2 months, mostly at night. No other bothersome symptoms. Currently on Norethindrone 5mg .  -Reduce Norethindrone to 2.5mg .  -Advise on lifestyle modifications for hot flashes (cotton clothing, avoiding triggers, cool packs).  -Plan to reassess in 6 months.      I personally spent 25 minutes face-to-face and non-face-to-face in the care of this patient, which includes all pre, intra, and post visit time on the date of service.  All documented time was specific to the E/M visit and does not include any procedures that may have been performed.             Patient ID: Frances Hernandez, female   DOB: [birth year], 47 y.o.   MRN: [id number]    Chief Complaint   Patient presents with    Vaginal Discharge       HPI  Frances Hernandez is a 47 y.o. female.  The patient, presents with complaints of vaginal itching and discharge. The itching, which has been present for approximately two to three weeks, is associated with a yellowish discharge. The patient has been using Lotrimin for a week to manage concurrent itching in the groin area, which has shown improvement. She has a history of yeast infections, with the last episode occurring in August.    The patient also reports experiencing hot flashes and night sweats for the past two months. She has been off Norethindrone for a month due to concerns it might be contributing to these symptoms, but the hot flashes have persisted. The cessation of Norethindrone has led to the return of cramping.    The patient has been managing the hot flashes with lifestyle modifications and does not feel she is severe enough to warrant treatment at this time. She has not noticed any specific triggers for the hot flashes.      Review of Systems  Review of Systems  Constitutional: negative for fatigue and weight loss  Respiratory: negative for cough and wheezing  Cardiovascular: negative for chest pain, fatigue and palpitations  Gastrointestinal: negative for abdominal pain and change in bowel habits  Genitourinary: see above  Integument/breast: negative for nipple discharge  Musculoskeletal:negative for myalgias  Neurological: negative for gait problems and tremors  Behavioral/Psych: negative for abusive relationship, depression  Endocrine: negative for temperature intolerance        The following portions of the patient's history were reviewed and updated as appropriate: allergies, current medications, past family history, past medical history, past social history, past surgical history, and problem list.    Blood pressure 140/99, pulse 69, weight 90.9 kg (200 lb 6.4 oz), not currently breastfeeding.    Physical Exam  Physical Exam  The sensitive parts of the examination were performed with a chaperone.  General:   alert   Skin:   no rash or abnormalities   Lungs:   clear to auscultation bilaterally   Heart:   regular rate and rhythm, S1, S2 normal, no  murmur, click, rub or gallop   Abdomen:  normal findings: no organomegaly, soft, non-tender and no hernia; no rash   Pelvis:  External genitalia: atrophic changes; skin dry, no erythema or swelling, no external discharge.   Urinary system: urethral meatus normal and bladder without fullness, nontender  Vaginal: normal without tenderness, induration or masses; brown discharge; pH < 4.5  Cervix: normal appearance  Adnexa: normal bimanual exam  Uterus: anteverted and non-tender, normal size

## 2023-04-19 ENCOUNTER — Ambulatory Visit
Admit: 2023-04-19 | Discharge: 2023-04-20 | Payer: BLUE CROSS/BLUE SHIELD | Attending: Physical Medicine & Rehabilitation | Primary: Physical Medicine & Rehabilitation

## 2023-04-19 DIAGNOSIS — M533 Sacrococcygeal disorders, not elsewhere classified: Principal | ICD-10-CM

## 2023-04-19 DIAGNOSIS — M545 Lumbar back pain: Principal | ICD-10-CM

## 2023-04-19 DIAGNOSIS — M5430 Sciatica, unspecified side: Principal | ICD-10-CM

## 2023-04-19 DIAGNOSIS — M25552 Pain in left hip: Principal | ICD-10-CM

## 2023-04-19 MED ORDER — METHYLPREDNISOLONE 4 MG TABLETS IN A DOSE PACK
0 refills | 0 days | Status: CP
Start: 2023-04-19 — End: ?
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
## 2023-04-22 NOTE — Unmapped (Signed)
I faxed the pt's referral for PT, demographic facesheet, and most recent progress note (from Dr. Riccardo Dubin) to [facility] [place] (fax: [phone]).

## 2023-04-22 NOTE — Unmapped (Signed)
Addended by: Valeda Malm on: 04/22/2023 10:41 AM     Modules accepted: Level of Service

## 2023-04-30 NOTE — Unmapped (Signed)
[facility] Delivery Pharm[facility] Refill Coordination Note    Specialty Lite Medication(s) to be Shipped:   Wegovy    Other medication(s) to be shipped: No additional medications requested for fill at this time     Frances Hernandez, DOB: 17-Jan-1976  Phone: [phone] (home)       All above HIPAA information was verified with patient.     Was a Nurse, learning disability used for this call? No    Changes to medications:  reports no changes at this time.  Changes to insurance: No      REFERRAL TO PHARMACIST     Referral to the pharmacist: Not needed      [facility]     Shipping address confirmed in Epic.     Delivery Scheduled: Yes, Expected medication delivery date: 11/27.     Medication will be delivered via Same Day Courier to the prescription address in Epic WAM.    Antonietta Barcelona   [facility] Special[facility] Home Delivery Pharmacy Specialty Technician

## 2023-05-01 MED FILL — WEGOVY 2.4 MG/0.75 ML SUBCUTANEOUS PEN INJECTOR: SUBCUTANEOUS | 28 days supply | Qty: 3 | Fill #1

## 2023-05-13 ENCOUNTER — Institutional Professional Consult (permissible substitution)
Admit: 2023-05-13 | Discharge: 2023-05-14 | Payer: BLUE CROSS/BLUE SHIELD | Attending: Student in an Organized Health Care Education/Training Program | Primary: Student in an Organized Health Care Education/Training Program

## 2023-05-13 DIAGNOSIS — R059 Cough, unspecified type: Principal | ICD-10-CM

## 2023-05-13 DIAGNOSIS — J4521 Mild intermittent asthma with (acute) exacerbation: Principal | ICD-10-CM

## 2023-05-13 DIAGNOSIS — J029 Acute pharyngitis, unspecified: Principal | ICD-10-CM

## 2023-05-13 MED ORDER — BUDESONIDE-FORMOTEROL HFA 80 MCG-4.5 MCG/ACTUATION AEROSOL INHALER
Freq: Two times a day (BID) | RESPIRATORY_TRACT | 0 refills | 31.00 days | Status: CP
Start: 2023-05-13 — End: ?

## 2023-05-13 NOTE — Unmapped (Signed)
Patient here for nurse visit.

## 2023-05-26 ENCOUNTER — Telehealth
Admit: 2023-05-26 | Discharge: 2023-05-26 | Payer: BLUE CROSS/BLUE SHIELD | Attending: Family Medicine | Primary: Family Medicine

## 2023-05-26 ENCOUNTER — Ambulatory Visit: Admit: 2023-05-26 | Discharge: 2023-05-26 | Payer: BLUE CROSS/BLUE SHIELD

## 2023-05-26 DIAGNOSIS — J329 Chronic sinusitis, unspecified: Principal | ICD-10-CM

## 2023-05-26 MED ORDER — PREDNISONE 20 MG TABLET
ORAL_TABLET | Freq: Every day | ORAL | 0 refills | 5.00 days | Status: CP
Start: 2023-05-26 — End: 2023-05-31

## 2023-05-26 NOTE — Unmapped (Signed)
[place] Hydrologist Encounter  This medical encounter was conducted virtually using Epic@  TeleHealth protocols.    Patient ID: Frances Hernandez is a 47 y.o. female who presents by video interaction for evaluation.    I have identified myself to the patient and conveyed my credentials to [employer].   Patient has signed informed consent on file in medical record.    Present on Video Call: Is there someone else in the room? No..    Assessment/Plan:    Diagnoses and all orders for this visit:    Sinusitis, unspecified chronicity, unspecified location    Other orders  -     predniSONE (DELTASONE) 20 MG tablet; Take 2 tablets (40 mg total) by mouth daily for 5 days.     Counseled patient on OTC & prescription medications for symptom management.    -- Patient verbalized an understanding of today's assessment and recommendations, as well as the purpose of ongoing medications.    Follow-up as Needed  and Follow-up with PCP    Medication adherence and barriers to the treatment plan have been addressed. Opportunities to optimize healthy behaviors have been discussed. Patient / caregiver voiced understanding.         Subjective:     HPI:    Frances Hernandez is 47 y.o. and presents today in the [facility] to be evaluated for ENT issues. The PCP for this patient is Straub, Elvis Coil, MD. Sinus pressure, congestion, and pain. Started yesterday. Tylenol Cold and flu & Flonase.    HPI     ROS    ROS negative unless notated in HPI above.    I have reviewed the problem list, past medical history, past family history, medications, and allergies and have updated/reconciled them if needed.    Objective:   Physical Exam  Constitutional:       Appearance: Normal appearance.   HENT:      Head: Normocephalic.   Pulmonary:      Effort: Pulmonary effort is normal.   Neurological:      Mental Status: She is alert and oriented to person, place, and time.   Psychiatric:         Mood and Affect: Mood normal. Behavior: Behavior normal.         As part of this Video Visit, no in-person exam was conducted.  Video interaction permitted the following observations.        The patient reports they are physically located in [place] and is currently: not at home. I conducted a audio/video visit. I spent  71m 00s on the video call with the patient. I spent an additional 5 minutes on pre- and post-visit activities on the date of service .

## 2023-05-28 NOTE — Unmapped (Signed)
Frances Hernandez requested a refill of their [facility] via IVR/Web. The [facility] has scheduled delivery per the patients request via Same Day Courier to be delivered to their prescription address on 05/30/23.

## 2023-05-30 MED FILL — WEGOVY 2.4 MG/0.75 ML SUBCUTANEOUS PEN INJECTOR: SUBCUTANEOUS | 28 days supply | Qty: 3 | Fill #2

## 2023-06-10 ENCOUNTER — Encounter: Admit: 2023-06-10 | Discharge: 2023-06-10 | Payer: BLUE CROSS/BLUE SHIELD

## 2023-06-10 ENCOUNTER — Ambulatory Visit
Admit: 2023-06-10 | Discharge: 2023-06-11 | Payer: BLUE CROSS/BLUE SHIELD | Attending: Student in an Organized Health Care Education/Training Program | Primary: Student in an Organized Health Care Education/Training Program

## 2023-06-10 DIAGNOSIS — E559 Vitamin D deficiency, unspecified: Principal | ICD-10-CM

## 2023-06-10 DIAGNOSIS — Z131 Encounter for screening for diabetes mellitus: Principal | ICD-10-CM

## 2023-06-10 LAB — HEMOGLOBIN A1C
ESTIMATED AVERAGE GLUCOSE: 88 mg/dL
HEMOGLOBIN A1C: 4.7 % — ABNORMAL LOW (ref 4.8–5.6)

## 2023-06-11 LAB — VITAMIN D 25 HYDROXY: VITAMIN D, TOTAL (25OH): 19 ng/mL — ABNORMAL LOW (ref 20.0–80.0)

## 2023-06-12 ENCOUNTER — Ambulatory Visit: Admit: 2023-06-12 | Discharge: 2023-06-13 | Payer: BLUE CROSS/BLUE SHIELD

## 2023-06-12 DIAGNOSIS — Z0000 Encounter for general adult medical examination without abnormal findings: Principal | ICD-10-CM

## 2023-06-12 DIAGNOSIS — Z1231 Encounter for screening mammogram for malignant neoplasm of breast: Principal | ICD-10-CM

## 2023-06-12 DIAGNOSIS — F4320 Adjustment disorder, unspecified: Principal | ICD-10-CM

## 2023-06-12 MED ORDER — FLUTICASONE PROPIONATE 50 MCG/ACTUATION NASAL SPRAY,SUSPENSION
Freq: Every day | NASAL | 11 refills | 60.00 days | Status: CP
Start: 2023-06-12 — End: 2025-04-28

## 2023-06-12 MED ORDER — MONTELUKAST 10 MG TABLET
ORAL_TABLET | Freq: Every evening | ORAL | 3 refills | 90.00 days | Status: CP
Start: 2023-06-12 — End: ?

## 2023-06-13 NOTE — Unmapped (Addendum)
#   Screenings and prevention:  - Lifestyle changes recommended including increased exercise and moderation of caloric intake.  - Recommended regular dental and vision screenings  - Recommended regular sunscreen use to prevent skin cancer  - Contraception: perimenopausal, on daily norethindrone for HMB  - Pap Smear (21-48 yo): UTD  - Mammogram: UTD and scheduled for 11/2023  - DEXA Scan (Age >12 or age <27 and 1 RF): start age 62  - CRC Screening (Age>45): due 03/2032  - LDCT chest yearly (>20pk-yrs smoking, 50-80yo, currently smoke or quit in last 39yr): n/a  - Depression Screening: neg  - Screening for alcohol/tobacco: neg  - Hep C screening (age 29-79, once unless high risk): not discussed today  - HIV screening (age 81-65, once unless high risk): prev neg, low risk  - Diabetes screen (age 46-70, overweight or obese): neg  - Statin (40-75, 10-yr risk of >10%):  The 10-year ASCVD risk score (Arnett DK, et al., 2019) is: 1.4%    Values used to calculate the score:      Age: 34 years      Sex: Female      Is Non-Hispanic African American: Yes      Diabetic: No      Tobacco smoker: No      Systolic Blood Pressure: 134 mmHg      Is BP treated: No      HDL Cholesterol: 54 mg/dL      Total Cholesterol: 161 mg/dL    # Vaccines:  - COVID: received  - Seasonal influenza: received  - Shingrix (>50yo): due age 42  - Pneumonia vaccine: received

## 2023-06-13 NOTE — Unmapped (Signed)
[facility] - [facility] Visit    Frances Hernandez is a 48 y.o. female being seen today for:    Assessment & Plan  Well adult exam  # Screenings and prevention:  - Lifestyle changes recommended including increased exercise and moderation of caloric intake.  - Recommended regular dental and vision screenings  - Recommended regular sunscreen use to prevent skin cancer  - Contraception: perimenopausal, on daily norethindrone for HMB  - Pap Smear (21-65 yo): UTD  - Mammogram: UTD and scheduled for 11/2023  - DEXA Scan (Age >69 or age <59 and 1 RF): start age 61  - CRC Screening (Age>45): due 03/2032  - LDCT chest yearly (>20pk-yrs smoking, 50-80yo, currently smoke or quit in last 6yr): n/a  - Depression Screening: neg  - Screening for alcohol/tobacco: neg  - Hep C screening (age 72-79, once unless high risk): not discussed today  - HIV screening (age 45-65, once unless high risk): prev neg, low risk  - Diabetes screen (age 67-70, overweight or obese): neg  - Statin (40-75, 10-yr risk of >10%):  The 10-year ASCVD risk score (Arnett DK, et al., 2019) is: 1.4%    Values used to calculate the score:      Age: 56 years      Sex: Female      Is Non-Hispanic African American: Yes      Diabetic: No      Tobacco smoker: No      Systolic Blood Pressure: 134 mmHg      Is BP treated: No      HDL Cholesterol: 54 mg/dL      Total Cholesterol: 161 mg/dL    # Vaccines:  - COVID: received  - Seasonal influenza: received  - Shingrix (>50yo): due age 59  - Pneumonia vaccine: received       Encounter for screening mammogram for malignant neoplasm of breast    Orders:    Mammo Digital Screening W Tomo Bilateral; Future    Adjustment disorder, unspecified type  Reema shared with me today that she has had a severely reduced appetite recently. 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 She worries about him a lot when she is home since he is on the road. We discussed that therapy could be beneficial to her and she agrees. She will call her insurance asap to find an in-network provider so she can get started. We will check in again in 2 months, sooner if worsening. Reassuring, no weight loss and recent labs looked good.        Return in about 2 months (around 08/10/2023) for f/u appetite.      Subjective:   HPI: Frances Hernandez is a 48 y.o. female being seen for:   Chief Complaint   Patient presents with    Annual Exam       #Well visit  Diet/ Exercise: goal: walk daily at work during lunch and on weekends;   Mood/ Sleep: sweats are better, sleeping better  Alcohol use: special occasions only;   No tobacco    Gynecologic History:   Follows with OB/Gyn  Now back on daily norethindrone  She tried going off of it but had bleeding and painful cramping so now back on it  She notes that her hot flashes have stopped recently    I have reviewed the patient's problem list, current medications, allergies, family history, and social history and updated them as  needed.      Objective:     Vitals:    06/12/23 1613   BP: 134/74   Pulse: 82   Temp: 36.8 ??C (98.2 ??F)     BP Readings from Last 3 Encounters:   06/12/23 134/74   04/12/23 140/99   03/29/23 125/90     Wt Readings from Last 5 Encounters:   06/12/23 89.9 kg (198 lb 3.2 oz)   04/19/23 90.7 kg (200 lb)   04/12/23 90.9 kg (200 lb 6.4 oz)   03/29/23 91.6 kg (202 lb)   03/11/23 90.3 kg (199 lb)     Physical exam:  GENERAL: Well appearing, in no acute distress  HEENT: PERRLA, EOMI, sclera anicteric, MMM, clear oropharynx, no cervical LAD, thyroid normal without enlargement or nodules, TMs normal bilaterally  CV: Regular rate and rhythm, no murmurs appreciated  PULM: Lungs are clear to auscultation bilaterally. No wheezes or rales. No increased work of breathing.  ABD: Normal active bowel sounds. Soft, nontender, nondistended.   GU: deferred  MSK: Normal ROM grossly throughout, normal gait   EXT: No peripheral edema. 2+ distal pulses  NEURO: Alert, oriented, normal speech, no focal findings or movement disorder noted

## 2023-06-20 ENCOUNTER — Ambulatory Visit: Admit: 2023-06-20 | Discharge: 2023-06-21 | Payer: BLUE CROSS/BLUE SHIELD

## 2023-06-20 DIAGNOSIS — F4320 Adjustment disorder, unspecified: Principal | ICD-10-CM

## 2023-06-20 DIAGNOSIS — F419 Anxiety disorder, unspecified: Principal | ICD-10-CM
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
## 2023-06-20 NOTE — Unmapped (Addendum)
You have a LOT going on- be kind and gentle with yourself :)    Every morning: take effexor 37.5mg  every single morning. This will help with anxiety and help steady your mood without making you sleepy.     Every night: take metformin, norethindrone, and topamax.     This will help you sleep better, steady your mood, and prevent migraines.           If you are having a panic attack, or you can't sleep, you may take the buspar.     All the other medicines are as needed.

## 2023-06-26 DIAGNOSIS — N939 Abnormal uterine and vaginal bleeding, unspecified: Principal | ICD-10-CM

## 2023-06-26 MED ORDER — NORETHINDRONE ACETATE 5 MG TABLET
ORAL_TABLET | Freq: Every day | ORAL | 3 refills | 90.00000 days
Start: 2023-06-26 — End: 2023-09-24

## 2023-06-30 NOTE — Unmapped (Signed)
Frances Hernandez requested a refill of their [facility] via IVR/Web. [facility] has scheduled delivery per the patients request via Same Day Courier to be delivered to their prescription address on 07/01/23.

## 2023-07-01 MED FILL — WEGOVY 2.4 MG/0.75 ML SUBCUTANEOUS PEN INJECTOR: SUBCUTANEOUS | 28 days supply | Qty: 3 | Fill #3

## 2023-07-01 MED FILL — TOPIRAMATE 100 MG TABLET: ORAL | 90 days supply | Qty: 90 | Fill #0

## 2023-07-02 ENCOUNTER — Ambulatory Visit
Admit: 2023-07-02 | Discharge: 2023-07-03 | Payer: BLUE CROSS/BLUE SHIELD | Attending: Student in an Organized Health Care Education/Training Program | Primary: Student in an Organized Health Care Education/Training Program

## 2023-07-02 DIAGNOSIS — J4521 Mild intermittent asthma with (acute) exacerbation: Principal | ICD-10-CM

## 2023-07-02 DIAGNOSIS — J069 Acute upper respiratory infection, unspecified: Principal | ICD-10-CM

## 2023-07-02 DIAGNOSIS — J101 Influenza due to other identified influenza virus with other respiratory manifestations: Principal | ICD-10-CM

## 2023-07-02 MED ORDER — OSELTAMIVIR 75 MG CAPSULE
ORAL_CAPSULE | Freq: Two times a day (BID) | ORAL | 0 refills | 5.00 days | Status: CP
Start: 2023-07-02 — End: 2023-07-07

## 2023-07-02 MED ORDER — ALBUTEROL SULFATE HFA 90 MCG/ACTUATION AEROSOL INHALER
RESPIRATORY_TRACT | 11 refills | 16.00 days | Status: CP | PRN
Start: 2023-07-02 — End: 2024-07-01

## 2023-07-02 MED ORDER — PREDNISONE 20 MG TABLET
ORAL_TABLET | Freq: Every day | ORAL | 0 refills | 5.00 days | Status: CP
Start: 2023-07-02 — End: 2023-07-07

## 2023-07-02 MED ORDER — BUDESONIDE-FORMOTEROL HFA 80 MCG-4.5 MCG/ACTUATION AEROSOL INHALER
Freq: Two times a day (BID) | RESPIRATORY_TRACT | 0 refills | 31.00 days | Status: CP
Start: 2023-07-02 — End: ?

## 2023-07-02 NOTE — Unmapped (Addendum)
Chronic condition with acute exacerbation in the setting of influenza A    Medical management:  Start Prednisone 40mg  every day x 5 days  Symbicort 2 puffs BID  Albuterol prn wheezing/shortness of breath  Tamiflu for Influenza A, starting at ~24-36 hours of symptoms    Follow up precautions discussed at length, and recommended for family to reach out to discuss exposure prophylaxis if high risk.

## 2023-07-02 NOTE — Unmapped (Signed)
Assessment and Plan:       Mild intermittent asthma with acute exacerbation  Assessment & Plan:  Chronic condition with acute exacerbation in the setting of influenza A    Medical management:  Start Prednisone 40mg  every day x 5 days  Symbicort 2 puffs BID  Albuterol prn wheezing/shortness of breath  Tamiflu for Influenza A, starting at ~24-36 hours of symptoms    Follow up precautions discussed at length, and recommended for family to reach out to discuss exposure prophylaxis if high risk.       Orders:  -     budesonide-formoteroL; Inhale 2 puffs two (2) times a day.  -     predniSONE; Take 2 tablets (40 mg total) by mouth daily for 5 days.  -     albuterol sulfate; Inhale 2 puffs every four (4) hours as needed for wheezing or shortness of breath.    Influenza A  -     oseltamivir; Take 1 capsule (75 mg total) by mouth two (2) times a day for 5 days.    Viral URI  -     POCT Rapid Influenza A/B, RSV, SARS-CoV2 NAA         Return if symptoms worsen or fail to improve.  Subjective:     HPI: Frances Hernandez is a 48 y.o. female here for sick visit. No chief complaint on file.    Sick visit same day work in   Symptoms started yesterday  Cough, congestion, feels warm (temp 100.2 on temporal check today so likely higher/febrile).  Flu A positive  Mild asthma exacerbation by exam     Expiratory wheezes, no retractions   No one else is sick at home at this time     All review of symptoms negative unless noted in HPI.    Objective:     Vitals:    07/02/23 0905   BP: 140/80   BP Site: L Arm   BP Position: Sitting   BP Cuff Size: Large   Pulse: 99   Resp: 18   Temp: 37.9 ??C (100.2 ??F)   TempSrc: Temporal   SpO2: 97%     There is no height or weight on file to calculate BMI.    Physical Exam:  General: Well developed. Well nourished.   HEENT:  Normocephalic.  Atraumatic.   Heart:  Regular rate and rhythm   Lungs:  No respiratory distress, retractions, or tachypnea.  Lungs with decreased air movement and mild diffuse expiratory wheezing.   Musculoskeletal: Moves all extremities normally, normal muscle tone   Skin:  Warm, dry, no rashes of visualized skin   Neuro:  Non-focal. Gait normal.   Psych:  Affect normal, answers questions appropriately, eye contact good, speech clear and coherent.      Note - This record has been created using [employer]. Chart creation errors have been sought, but may not always have been located. Such creation errors do not reflect on the standard of medical care.

## 2023-07-22 ENCOUNTER — Ambulatory Visit
Admit: 2023-07-22 | Discharge: 2023-07-23 | Payer: BLUE CROSS/BLUE SHIELD | Attending: Internal Medicine | Primary: Internal Medicine

## 2023-07-22 DIAGNOSIS — E559 Vitamin D deficiency, unspecified: Principal | ICD-10-CM

## 2023-07-22 DIAGNOSIS — Z9884 Bariatric surgery status: Principal | ICD-10-CM

## 2023-07-22 DIAGNOSIS — E66812 Class II obesity: Principal | ICD-10-CM

## 2023-07-22 DIAGNOSIS — Z5181 Encounter for therapeutic drug level monitoring: Principal | ICD-10-CM

## 2023-07-22 LAB — COMPREHENSIVE METABOLIC PANEL
ALBUMIN: 3.7 g/dL (ref 3.4–5.0)
ALKALINE PHOSPHATASE: 50 U/L (ref 46–116)
ALT (SGPT): <7 U/L — ABNORMAL LOW (ref 10–49)
ANION GAP: 10 mmol/L (ref 5–14)
AST (SGOT): 14 U/L (ref <=34)
BILIRUBIN TOTAL: 1.5 mg/dL — ABNORMAL HIGH (ref 0.3–1.2)
BLOOD UREA NITROGEN: 13 mg/dL (ref 9–23)
BUN / CREAT RATIO: 18
CALCIUM: 9.3 mg/dL (ref 8.7–10.4)
CHLORIDE: 106 mmol/L (ref 98–107)
CO2: 27 mmol/L (ref 20.0–31.0)
CREATININE: 0.74 mg/dL (ref 0.55–1.02)
EGFR CKD-EPI (2021) FEMALE: >90 mL/min/{1.73_m2} (ref >=60)
GLUCOSE RANDOM: 77 mg/dL (ref 70–99)
POTASSIUM: 4 mmol/L (ref 3.4–4.8)
PROTEIN TOTAL: 7.3 g/dL (ref 5.7–8.2)
SODIUM: 143 mmol/L (ref 135–145)

## 2023-07-22 MED ORDER — PHENTERMINE 37.5 MG TABLET
ORAL_TABLET | Freq: Every day | ORAL | 5 refills | 30.00 days | Status: CP
Start: 2023-07-22 — End: ?
  Filled 2023-08-08: qty 30, 30d supply, fill #0

## 2023-07-22 NOTE — Unmapped (Signed)
 [facility]  [address]  [place] [address]  Phone: [phone]  Fax: [phone]      Weight Management Clinic Visit    - prior patient of NP Onime  - 05/02/22 first appointment with Dr. Bronson Curb       HISTORY:    Summary at initial presentation  Frances Hernandez is a 48 y.o. female and There is no height or weight on file to calculate BMI. had   ?s/p vertical sleeve gastrectomy by Dr. Leona Carry at [facility] on 2016. Marland Kitchen  She has presented with initial weight loss of 32 % at nadir followed by weight regain of 46% . The BMI prior to surgery was 56 and a nadir BMI after surgery was 32.   Present co-morbidities include n/a.   Her primary issue at this time is weight regain.     Treatment course  Starting weight at presentation: 232 lbs   Weight today: 189 lb  Total weight loss since presentation: 52 lbs and regained 13 lb  Percent weight loss since presentation: 22%   Percent weight loss with lifestyle/liraglutide: n/a   Percent weight loss with lifestyle/metformin: n/a   Percent weight loss with lifestyle/semaglutide: 22%     08/29/22 Weight 208 lbs  11/2022  Weight: 202 lbs   03/11/23 Weight: 199 lbs, BMI 36 kg/m2       -   Weight at home - since bring back on Wegovy - has lost from 206 to 197 lbs on home scale - about 9 lbs          Previous use of anti-obesity medications:  Phentermine: yes  12 lb loss over 3 months and side effects: none  Metformin: no  Topiramate: no  Bupropion: no  Liraglutide: Yes 02/2019   Semaglutide: Yes -- tolerating it well   Tirzepatide: no   SGLT-2 inhibitor: no  Orlistat :no  Other agents:    Obesity ROS  Glaucoma: no  Palpitations: no  Chest Pain: no  Headaches: yes, 2-3 per month last 30 mins to few hrs  Nephrolithiasis: no  Seizures: no  H/o pancreatitis: no  Personal or family history of medullary cancer of thyroid:no     Musculoskeletal pain yes, occassional shoulder muscle spasms  Urinary incontinence no  Heartburn no   Abdominal pain no     Narrative history  ??Denies any side effect such as N/D/V/C.    Reports good appetite suppression and early satiety  Doing very well with Wegovy. Had stopped taking Lyrica -weight gain.   Concern about current weight regained.   Taking a women's one a day vit with biotin gummie.     Diet:    3 small meals per day with protein. Sandwich. Veggies and Fruit daily.   Started drinking sweet tea at work  Not consistent with appropriate protein per meal    ?Exercise: walking and yoga 30 min x3 times weekly.     Sleep: sleep is pretty good   ??  Stress: higher related to home issues but managble.   ???  Medications:    Eating disorders:      05/02/22 CLINIC APPOINTMENT    - s/p sleeve gastrectomy with Dr. Ferd Glassing in 2016  - Current BMI  31.18 kg/m2     -She reports doing well with the below regimen.  She denies nausea, vomiting, abdominal pain, diarrhea and constipation on Wegovy and metformin.  -Denies problems with topiramate 50 mg daily (no paresthesias, dysgeusia,  or brain fog) but notes that she has been taking this for migraines.  She had a pressure implant applied in her left ear which is helping significantly with her migraines and she reduced her topiramate to 150 mg tablet once a week.     Current Meds;  - metformin XR 1000 mg daily   - topiramate 50 mg daily  - Wegovy 2.4 mg once a week     Post bariatric nutrition supplements:  -Women's One a Day multivitamin-1 pill once a day  -Recommended to take vitamin D supplement but has not yet started 1      Treatment course  Starting weight at presentation to [facility]: 232 lbs   Weight today 176 lbs  from 189 lb    Total weight loss since presentation:       - 56 lbs   Percent weight loss since presentation:   24.1 %     Percent weight loss with lifestyle/liraglutide: n/a   Percent weight loss with lifestyle/metformin: n/a   Percent weight loss with lifestyle/semaglutide: 24.1%     -Reviewed her recent labs which are notable for vitamin D deficiency.  She was instructed to take a supplement by her PCP but has not yet obtained 1.    -We also discussed her low folate which I believe was checked due to concern for mild anemia in the past.  She has had a normal vitamin B12 level.    -Last post bariatric nutrition labs were in November 2022    -She had an asthma flare in the last 2 weeks for which she was placed on amoxicillin and a prednisone short course of 20 mg daily for 5 days.  She has stopped this but she did have an elevated blood pressure on intake today although she does not have a prior diagnosis of hypertension and notes her blood pressure is usually normal.    -Repeated blood pressure with a manual cuff and she was 134/85.  We discussed continuing to monitor her blood pressure.    08/29/22 CLINIC APPOINTMENT    - pt well appearing but has had sig weight gain off of Wegovy     - loss of insurance coverage     => insurance coverage to change next week       Current Meds;  - metformin XR 1000 mg daily   - topiramate 50 mg daily    - Wegovy 2.4 mg once a week   => no longer provided     Post bariatric nutrition supplements:  -Women's One a Day multivitamin-1 pill once a day  -Recommended to take vitamin D supplement but has not yet started 1      Treatment course  Starting weight at presentation to [facility]: 232 lbs   Weight today 176 lbs  from 189 lb  Weight today 08/29/22:  208 lbs, BMI 35.8 kg/m2  => weight gain off of Wegovy     Total weight loss since presentation:       - 56 lbs   Percent weight loss since presentation:   24.1 %     Percent weight loss with lifestyle/liraglutide: n/a   Percent weight loss with lifestyle/metformin: n/a   Percent weight loss with lifestyle/semaglutide: 24.1%     11/21/22 VIDEO APPOINTMENT    - pt had been off of Wegovy for about 4 months  - was off due to supply problems    - she restarted at 2.4 mg dose when available and experienced nausea  and vomiting     - has now restarted lowest dose and tolerating without a problem    Current Meds;  - metformin XR 1000 mg daily   - topiramate 100 mg daily  - Wegovy 0.25 mg once a week  => restarted last week     Post bariatric nutrition supplements:  -Women's One a Day multivitamin-1 pill once a day  -Ergocalciferol once a week       Treatment course  Starting weight at presentation to [facility]: 232 lbs   Weight today 176 lbs  from 189 lb  Weight today 08/29/22:  208 lbs, BMI 35.8 kg/m2  => weight gain off of [facility]     11/01/22 208 Weight 208 lbs      Change:  0 lbs    11/21/22 203 lbs      Change: - 5 lbs    Total weight loss since presentation:       - 56 lbs   Percent weight loss since presentation:   24.1 %     Percent weight loss with lifestyle/liraglutide: n/a   Percent weight loss with lifestyle/metformin: n/a   Percent weight loss with lifestyle/semaglutide: 24.1%     03/11/23 CLINIC APPOINTMENT    - pt well appearing  - she restarted Wegoyv and tolerated well until she got to the highest dose again - first dose of Wegovy on Saturday, had episode of vomiting (fluid, not food material) yesterday (first episode); she took a dose of antivert which she takes for vertigo in the last 6 months to see if it would help with nausea, no further episodes    Diet:   - avoiding carbs - bread, pasta, rice   - hunger decreased    Physical Activity   - reaching 10,000 steps daily (monitors with FitBit)  - walks with husband who habitually hits 30,000 steps  => discussed adding resistance training     Current Meds:  - metformin XR 1000 mg daily => tolerating  - topiramate 100 mg daily  => taking nightly, no side effects   - Wegovy 2.4 mg once a week  => first dose Saturday, 03/09/23   - phentermine 37.5 mg daily => taking every other day     Post bariatric nutrition supplements:  -Women's One a Day multivitamin => 1 pill once a day  -Ergocalciferol once a week  => did not start in June as she recalled having a high vitamin D at one point in the past     Treatment course  Starting weight at presentation to [facility]: 232 lbs Weight today 176 lbs  from 189 lb  Weight today 08/29/22:  208 lbs, BMI 35.8 kg/m2  => weight gain off of [facility]     11/01/22 208 Weight 208 lbs      Change:  0 lbs    11/21/22 203 lbs      Change: - 5 lbs  03/11/23 Weight: 199 lbs (has lost about 9 lbs by home scales since back on Wegovy)     Total weight loss since presentation:       - 56 lbs   Percent weight loss since presentation:   24.1 %     Percent weight loss with lifestyle/liraglutide: n/a   Percent weight loss with lifestyle/metformin: n/a   Percent weight loss with lifestyle/semaglutide: 24.1%     07/22/2023 CLINIC APPOINTMENT    - pt doing well overall  -had flu about 2 and half weeks ago, recovered  -  has been out of phentermine for about 6-8 weeks, notices the difference in appetite  - feels like weight loss has plateaued   - tolerating metformin and topiramate; no GI issues on Wegovy       - Zepbound not covered yet by insurance    Nutrition: struggling with hunger   - taking one a day MVI and confirms taking ergocalciferol once weekly since early January      Physical Activity:  - not optimal  - discussed importance of exercise re: long term weight loss success     Current Meds:  - metformin XR 1000 mg daily   - topiramate 100 mg daily  => taking nightly, no side effects   - Wegovy 2.4 mg once a week (1st dose Saturday, 03/09/23)  - phentermine 37.5 mg daily => not taking     Post bariatric nutrition supplements:  -Women's One a Day multivitamin 1 pill once a day  -Ergocalciferol once a week  => did not start in June as she recalled having a high vitamin D at one point in the past         Treatment course  Starting weight at presentation to [facility]: 232 lbs   Weight today 176 lbs  from 189 lb  Weight today 08/29/22:  208 lbs, BMI 35.8 kg/m2  => weight gain off of [facility]     11/01/22 208 Weight 208 lbs      Change:  0 lbs    11/21/22 203 lbs      Change: - 5 lbs  03/11/23 Weight: 199 lbs (has lost about 9 lbs by home scales since back on Wegovy)   07/22/23 Weight:  192 lbs, BMI 35.26 kg/m2       Change:  - 7 lbs    Total weight loss since presentation:  -40 lbs, losing weight again now back on Wegovy        (peak weight loss - 63 lbs, - 27.2 %     Percent weight loss with lifestyle/liraglutide: n/a   Percent weight loss with lifestyle/metformin: n/a   Percent weight loss with lifestyle/semaglutide: 27%       Patient Active Problem List    Diagnosis Date Noted    Left hip pain 03/29/2023    Anterior knee pain, right 02/18/2023    Well adult exam 04/10/2022    Heavy menstrual bleeding 07/14/2021    Intertrigo 03/31/2021    Lumbar paraspinal muscle spasm 04/14/2020    Chronic bilateral low back pain with right-sided sciatica 04/14/2020    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    Migraine without aura and without status migrainosus, not intractable 01/20/2020    Vertigo, benign positional 08/14/2019    Chronic endometritis 04/16/2018    Seasonal allergies 06/29/2017    Class 1 obesity due to excess calories without serious comorbidity with body mass index (BMI) of 32.0 to 32.9 in adult 04/10/2016    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 8.5 g 11    budesonide-formoterol (SYMBICORT) 80-4.5 mcg/actuation inhaler Inhale 2 puffs two (2) times a day. 10.2 g 0    busPIRone (BUSPAR) 5 MG tablet Take  1 tablet (5 mg total) by mouth every eight (8) hours as needed. 20 tablet 1    diclofenac sodium (VOLTAREN) 1 % gel Apply 2 g topically four (4) times a day. 350 g 0    ergocalciferol-1,250 mcg, 50,000 unit, (DRISDOL) 1,250 mcg (50,000 unit) capsule Take one pill by mouth once a week for 12 weeks, then decrease to one pill every two weeks 12 capsule 1 fluticasone propionate (FLONASE) 50 mcg/actuation nasal spray Instill 2 sprays into each nostril daily. 16 g 11    meclizine (ANTIVERT) 25 mg tablet Take 1 tablet (25 mg total) by mouth Three (3) times a day as needed for dizziness or nausea. 30 tablet 1    metFORMIN (GLUCOPHAGE-XR) 500 MG 24 hr tablet Take 2 tablets (1,000 mg total) by mouth daily with evening meal. 180 tablet 2    montelukast (SINGULAIR) 10 mg tablet Take 1 tablet (10 mg total) by mouth nightly. 90 tablet 3    norethindrone (AYGESTIN) 5 mg tablet Take 0.5 tablets (2.5 mg total) by mouth daily. 45 tablet 3    ondansetron (ZOFRAN) 4 MG tablet Take 1 tablet (4 mg total) by mouth every eight (8) hours as needed for nausea. 20 tablet 0    ondansetron (ZOFRAN-ODT) 4 MG disintegrating tablet Dissolve 1 tablet (4 mg total) in the mouth every eight (8) hours as needed for nausea. 20 tablet 2    semaglutide, weight loss, (WEGOVY) 2.4 mg/0.75 mL injection pen Inject 2.4 mg under the skin every seven (7) days. 9 mL 3    tizanidine (ZANAFLEX) 4 MG tablet Take 1 tablet (4 mg total) by mouth every eight (8) hours as needed (for muscle spasms). 60 tablet 1    topiramate (TOPAMAX) 100 MG tablet Take 1 tablet (100 mg total) by mouth nightly. 90 tablet 3    triamcinolone (KENALOG) 0.1 % cream APPLY A THIN LAYER TO THE AFFECTED AREA UNTIL FLAT AND NO LONGER ITCHY FOR A MAX OF 2 WEEKS ON THE FACE AND 4 WEEKS ON HANDS AND FEET PER FLARE      venlafaxine (EFFEXOR XR) 37.5 MG 24 hr capsule Take 1 capsule (37.5 mg total) by mouth daily. 30 capsule 2    vit B complx-folic ac-C-biotin 1 mg-100 mg- 300 mcg Tab       [EXPIRED] oseltamivir (TAMIFLU) 75 MG capsule Take 1 capsule (75 mg total) by mouth two (2) times a day for 5 days. (Patient not taking: Reported on 07/22/2023) 10 capsule 0    [EXPIRED] predniSONE (DELTASONE) 20 MG tablet Take 2 tablets (40 mg total) by mouth daily for 5 days. (Patient not taking: Reported on 07/22/2023) 10 tablet 0     No current facility-administered medications on file prior to visit.       OBJECTIVE    Vital Signs     Vitals:    07/22/23 0837   BP: 113/80   BP Site: L Arm   BP Position: Sitting   Pulse: 82   Weight: 87.5 kg (192 lb 12.8 oz)   Height: 157.5 cm (5' 2)     Waist Circf:  36 inches    Physical Exam:   Gen: well appearing on screen     Pscyh: appropriate affect     DATA REVIEW:    Component      Latest Ref Rng 02/06/2023   Sodium      135 - 145 mmol/L 141    Potassium      3.3 - 4.7  mmol/L 3.8    Chloride      97 - 107 mmol/L 105    CO2      21.0 - 32.0 mmol/L 29.2    Anion Gap      7 - 15 mmol/L 7    Bun      7 - 18 mg/dL 15    Creatinine      3.29 - 0.95 mg/dL 5.18    BUN/Creatinine Ratio 19    eGFR CKD-EPI (2021) Female      >=60 mL/min/1.80m2 90    Glucose      70 - 179 mg/dL 84    Calcium      8.5 - 10.1 mg/dL 8.6    Albumin      3.4 - 5.0 g/dL 3.6    Total Protein      6.4 - 8.2 g/dL 7.4    Total Bilirubin      0.2 - 1.0 mg/dL 0.8    SGOT (AST)      15 - 37 U/L 15    ALT      14 - 59 U/L 18    Alkaline Phosphatase      46 - 116 U/L 58    WBC      3.6 - 11.2 10*9/L 6.7    RBC      3.95 - 5.13 10*12/L 4.41    HGB      11.3 - 14.9 g/dL 84.1    HCT      66.0 - 44.0 % 38.5    MCV      77.6 - 95.7 fL 87.3    MCH      25.9 - 32.4 pg 28.6    MCHC      32.0 - 36.0 g/dL 63.0    RDW      16.0 - 15.2 % 13.5    MPV      6.8 - 10.7 fL 8.8    Platelet      150 - 450 10*9/L 212                04/2021 labs reviewed  Low folate, Transferrin     Lab Results   Component Value Date    Hemoglobin A1C 4.7 (L) 06/10/2023    Hemoglobin A1C 4.6 (L) 04/10/2022    Hemoglobin A1C 4.5 (L) 05/03/2021    TSH 1.496 03/11/2023    ALT 18 02/06/2023    ALT 22 08/07/2022    ALT 8 (L) 05/02/2022    AST 15 02/06/2023    AST 23 08/07/2022    AST 13 05/02/2022    Creatinine 0.81 02/06/2023    Creatinine 0.70 09/27/2022    Creatinine 0.73 08/07/2022     Component      Latest Ref Rng 05/03/2021   WBC      3.6 - 11.2 10*9/L 5.7    RBC      3.95 - 5.13 10*12/L 4.11 HGB      11.3 - 14.9 g/dL 10.9    HCT      32.3 - 44.0 % 35.9    MCV      77.6 - 95.7 fL 87.2    MCH      25.9 - 32.4 pg 28.5    MCHC      32.0 - 36.0 g/dL 55.7    RDW      32.2 - 15.2 % 13.5    MPV      6.8 -  10.7 fL 9.3    Platelet      150 - 450 10*9/L 196        Component      Latest Ref Rng 05/03/2021 04/10/2022   Sodium      135 - 145 mmol/L  141    Potassium      3.4 - 4.8 mmol/L  4.0    Chloride      98 - 107 mmol/L  111 (H)    CO2      20.0 - 31.0 mmol/L  23.0    Anion Gap      5 - 14 mmol/L  7    Bun      9 - 23 mg/dL 14  12    Creatinine      0.55 - 1.02 mg/dL 1.30  8.65    BUN/Creatinine Ratio  16    eGFR CKD-EPI (2021) Female      >=60 mL/min/1.15m2 >90  >90    Glucose      70 - 99 mg/dL  81    Calcium      8.7 - 10.4 mg/dL 8.7  8.9    Albumin      3.4 - 5.0 g/dL  3.8    Total Protein      5.7 - 8.2 g/dL  7.2    Total Bilirubin      0.3 - 1.2 mg/dL  1.4 (H)    SGOT (AST)      <=34 U/L 15  15    ALT      10 - 49 U/L 8 (L)  9 (L)    Alkaline Phosphatase      46 - 116 U/L  39 (L)    Triglycerides      0 - 150 mg/dL  61    Cholesterol      <=200 mg/dL  784    HDL      40 - 60 mg/dL  48    LDL calculated      40 - 99 mg/dL  99    VLDL Cholesterol Cal      9 - 37 mg/dL  69.6    Chol/HDL Ratio      1.0 - 4.5   3.3    Non-HDL Cholesterol      70 - 295 mg/dL  284    FASTING  Yes    Iron      50 - 170 ug/dL 132     TIBC      440.1 - 425.0 ug/dL 027.2     Transferrin      250.0 - 380.0 mg/dL 536.6 (L)     Iron Saturation (%)      % 35     Hemoglobin A1c      4.8 - 5.6 % 4.5 (L)  4.6 (L)    Estimated Average Glucose      mg/dL 82  85    Vitamin E      5.5 - 17.0 mg/L 8.6     Vitamin C      0.4 - 2.0 mg/dL 0.4     Vitamin B6      5 - 50 mcg/L 8     Vitamin B1      70 - 180 nmol/L 103     Vitamin A      32.5 - 78.0 mcg/dL 44.0     Vitamin D Total (25OH)      20.0 - 80.0 ng/mL  23.6  19.2 (L)    Vitamin B-12      211 - 911 pg/ml 347     Ferritin      7.3 - 270.7 ng/mL 21.9     TSH      0.550 - 4.780 uIU/mL 1.848 Folate      >=5.4 ng/mL 4.5 (L)        Legend:  (L) Low  (H) High      ASSESSMENT AND PLAN:    07/22/23     # Obesity -- weight gain off of Weogvy (~30 lbs)   - 2016 Laparoscopic Sleeve Gastrectomy   - 05/02/22 BMI 31.89 kg/m2     Current Meds:  - Continue metformin XR 1000 mg daily   - Continue topiramate 100 mg daily  - Continue Wegovy 2.4 mg once a week due to length of time off of medication    [ ]   Restart hentemrine 37.5 mg daily (may divide tablet daily, AM and midday, not post 2 pm)      # S/p Vertical Sleeve Gastrectomy by Dr. Ferd Glassing in 2016  - Nov. 2023 labs reviewed     - continue vitamin support:       - Women's One A Day     - Jan 2025 restarted ergocalciferol weekly       # Vitamin D Deficiency -   -Start 12-week course of ergocalciferol followed by transition to daily cholecalciferol as instructed  = change to maintenance dose 50K every other week   => pt did not restart vitamin D supplement in June - states she recalled having too high a vit D level but that was in 2021    - 06/10/23 Vitamin D 19       => weekly ergolcalciferol for the last 6 weeks   [  ] vitamin D level today    # Migraines  - taking topiramate 100 mg once a day, previously started for migraine prophylaxis.    - reduction in migraines with pin placement in left ear.    - tolerating well  - 02/06/23 CMP wnls     [  ] labs today as detailed below     There are no Patient Instructions on file for this visit.      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        FOLLOW UP PLAN:    Return in about 3 months (around 10/19/2023).      Visit Duration    I personally spent 30 minutes face-to-face and non-face-to-face in the care of this patient, which includes all  pre, intra, and post visit time on the date of service.  Billing based on medical decision making.         Enid Baas Bronson Curb, MD   [facility]  [address]  [place] [address]  Phone: [phone]  Fax: [phone]       Note - This record has been created using Dragon software. Chart creation errors have been sought, but may not always have been located.   Such creation errors do not reflect on the standard of medical care.

## 2023-07-23 LAB — VITAMIN D 25 HYDROXY: VITAMIN D, TOTAL (25OH): 20.5 ng/mL (ref 20.0–80.0)

## 2023-07-26 MED FILL — WEGOVY 2.4 MG/0.75 ML SUBCUTANEOUS PEN INJECTOR: SUBCUTANEOUS | 28 days supply | Qty: 3 | Fill #4

## 2023-07-26 NOTE — Unmapped (Signed)
 Frances Hernandez requested a refill of their [facility] via IVR/Web. The [facility] has scheduled delivery per the patients request via Same Day Courier to be delivered to their prescription address on 07/26/23.

## 2023-08-07 DIAGNOSIS — E66812 Class 2 obesity: Principal | ICD-10-CM

## 2023-08-12 ENCOUNTER — Encounter: Admit: 2023-08-12 | Discharge: 2023-08-13 | Payer: BLUE CROSS/BLUE SHIELD

## 2023-08-12 DIAGNOSIS — R634 Abnormal weight loss: Principal | ICD-10-CM

## 2023-08-12 NOTE — Unmapped (Signed)
 Video Visit Note  This medical encounter was conducted virtually using [email or web address]  [employer] protocols.    I have identified myself to the patient and conveyed my credentials to Frances Hernandez  Patient/proxy has provided verbal consent to proceed with this video visit.  In case we get disconnected, [phone] (home)   In case of Emergency, patient's current location: Other: work  Is there someone else in the room? No.     Frances Hernandez is a 48 y.o. female that presents for a video visit today regarding the following issues:    Assessment/Plan:      Problem List Items Addressed This Visit    None  Visit Diagnoses         Unintended weight loss    -  Primary        Her appetite has returned and her weight loss has stopped. Weight is stable from her last [facility] visit. This is very reassuring.   She has been keeping in close contact with her son and feels well supported and less anxious.   Doing well overall and no other complaints.       The patient reports they are physically located in [place] and is currently: not at home. I conducted a audio/video visit. I spent  94m 24s on the video call with the patient. I spent an additional 3 minutes on pre- and post-visit activities on the date of service .         Subjective:     HPI  Frances Hernandez is a 48 y.o. female requesting a video visit to discuss the following issues:    # F/u  - notes her appetite is better; weight loss has stopped  Current weight is 193 pounds; last time was 198 pounds  Eating 2 meals per day  Waiting on mri with [facility] for her back issues  No other concerns today      ROS  As per HPI.    HISTORY  I have reviewed the patient's problem list, current medications, and allergies and have updated/reconciled them as needed.    Objective:   Patient Reported Blood Pressure Readings    General: Well appearing , in no acute distress  Skin: Color is normal. No rashes.  Psych: Appropriate affect, normal mood  Resp: Normal work of breathing, no retractions

## 2023-08-14 DIAGNOSIS — M5416 Radiculopathy, lumbar region: Principal | ICD-10-CM

## 2023-08-19 ENCOUNTER — Inpatient Hospital Stay: Admit: 2023-08-19 | Discharge: 2023-08-20 | Payer: BLUE CROSS/BLUE SHIELD

## 2023-08-19 NOTE — Unmapped (Signed)
 patient left my chart message wanting a follow up appt with Tamela Oddi in May

## 2023-08-23 DIAGNOSIS — E66812 Class II obesity: Principal | ICD-10-CM

## 2023-08-23 DIAGNOSIS — Z9884 Bariatric surgery status: Principal | ICD-10-CM

## 2023-08-27 MED FILL — WEGOVY 2.4 MG/0.75 ML SUBCUTANEOUS PEN INJECTOR: SUBCUTANEOUS | 28 days supply | Qty: 3 | Fill #5

## 2023-08-27 NOTE — Unmapped (Signed)
 Frances Hernandez requested a refill of their [facility] via IVR/Web. The [facility] has scheduled delivery per the patients request via Same Day Courier to be delivered to their prescription address on 08/27/23.

## 2023-09-12 NOTE — Unmapped (Signed)
Pt now scheduled for appt.

## 2023-09-26 NOTE — Unmapped (Addendum)
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 of 90% for advanced fibrosis). Fib-4 between 1.45 and 3.25 are deemed inconclusive. Fib-4 > 3.25 indicates cirrhosis (with a positive predictive value of 65% for advanced fibrosis).     Last Labs:  Pregnancy test No results found for: PREGPOC  No results found for: HCGQUANT   WBC Lab Results   Component Value Date    WBC 6.7 02/06/2023      Hemoglobin Lab Results   Component Value Date    HGB 12.6 02/06/2023      Platelets: Lab Results   Component Value Date    PLT 212 02/06/2023  Creatinine: Lab Results   Component Value Date    CREATININE 0.74 07/22/2023      Sodium:  Lab Results   Component Value Date    NA 143 07/22/2023      Potassium: Lab Results   Component Value Date    K 4.0 07/22/2023      Glucose Lab Results   Component Value Date    GLU 77 07/22/2023      Calcium Lab Results   Component Value Date    CALCIUM 9.3 07/22/2023      AST Lab Results   Component Value Date    AST 14 07/22/2023      ALT Lab Results   Component Value Date    ALT <7 (L) 07/22/2023      Hemoglobin A1c Lab Results   Component Value Date    A1C 4.7 (L) 06/10/2023      Total Cholesterol Lab Results   Component Value Date    CHOL 161 02/06/2023      Triglycerides Lab Results   Component Value Date    TRIG 98 02/06/2023      HDL Lab Results   Component Value Date    HDL 54 02/06/2023      LDL Lab Results   Component Value Date    LDL 87 02/06/2023      TSH Lab Results   Component Value Date    TSH 1.496 03/11/2023 Lipase No results found for: LIPASE     Lab work: reviewed in [facility] and is noted    Hemoglobin A1C   Date Value Ref Range Status   06/10/2023 4.7 (L) 4.8 - 5.6 % Final   04/10/2022 4.6 (L) 4.8 - 5.6 % Final   05/03/2021 4.5 (L) 4.8 - 5.6 % Final     TSH   Date Value Ref Range Status   03/11/2023 1.496 0.550 - 4.780 uIU/mL Final     ALT   Date Value Ref Range Status   07/22/2023 <7 (L) 10 - 49 U/L Final   02/06/2023 18 14 - 59 U/L Final   08/07/2022 22 10 - 49 U/L Final     AST   Date Value Ref Range Status   07/22/2023 14 <=34 U/L Final   02/06/2023 15 15 - 37 U/L Final   08/07/2022 23 <=34 U/L Final     Creatinine   Date Value Ref Range Status   07/22/2023 0.74 0.55 - 1.02 mg/dL Final   47/82/9562 1.30 0.51 - 0.95 mg/dL Final   86/57/8469 6.29 0.60 - 1.00 mg/dL Final   .    Prior Post Sleeve Gastrectomy Vitamin levels (with reference ranges):    Vitamin D (20.0 - 80.0 ng/mL vit D) Lab Results   Component Value Date    VITDTOTAL 20.5 07/22/2023      Vitamin A (32.5 - 78.0 mcg/dL) Lab Results   Component Value Date    VITAMINA 50.8 05/02/2022      Vitamin E (5.5 - 17.0 mg/L) Lab Results   Component Value Date    VITAME 8.9 05/02/2022      Vitamin K (0.10 - 2.20 ng/mL reference range) Lab Results   Component Value Date    VITAMINK1 0.15 05/02/2022      Vitamin B 12 (211 - 911 pg/ml) Lab Results   Component Value Date    VITAMINB12 279 05/02/2022      Iron and TIBC (50 - 170 ug/dL iron;   250 - 425 ug/dL  TIBC  20 - 55 % iron saturation) Lab Results   Component Value Date    IRON 150 05/02/2022     Lab Results   Component Value Date    TIBC 309 05/02/2022     Lab Results   Component Value Date    LABIRON 49 05/02/2022      Ferritin (7.3 - 270.7 ng/mL) Lab Results   Component Value Date    FERRITIN 37.8 05/02/2022      Calcium Lab Results   Component Value Date    CALCIUM 9.3 07/22/2023      Magnesium No results found for: MG   Vitamin B-1 (70 - 180 nmol/L) Lab Results   Component Value Date    VTB1 106 05/02/2022      Vitamin B-6 (5 - 50 mcg/L) Lab Results   Component Value Date    THYROGLB 5 05/02/2022      Vitamin b-12 (211 - 911 pg/ml) Lab Results   Component Value Date    VITAMINB12 279 05/02/2022              Visit duration:  I personally spent 40 minutes face-to-face and non-face-to-face in the care of this patient, which includes all pre, intra, and post visit time on the date of service.    Pt verbally consented to [facility] charting.    Barriers to goals identified and addressed. Pertinent handouts were given today and reviewed with the patient as indicated.  The Care Plan and Self-Management goals have been included on the AVS and the AVS has been printed.  Any outside resources or referrals needed at this time are noted above. Patient's current medications have been reviewed. Any new medications prescribed have been discussed, and side effects have been addressed.  At each visit we review the patient's medication list for possible dose reductions or discontinuation of medications for co-morbid conditions related to weight loss. Have assessed the patient's understanding, respsonse, and barriers to adherence to medications.  Patient voiced understanding and all questions have been answered to satisfaction.

## 2023-09-27 ENCOUNTER — Ambulatory Visit: Admit: 2023-09-27 | Discharge: 2023-09-28 | Payer: BLUE CROSS/BLUE SHIELD | Attending: Family | Primary: Family

## 2023-09-27 DIAGNOSIS — R17 Unspecified jaundice: Principal | ICD-10-CM

## 2023-09-27 DIAGNOSIS — Z6834 Body mass index (BMI) 34.0-34.9, adult: Principal | ICD-10-CM

## 2023-09-27 DIAGNOSIS — Z903 Acquired absence of stomach [part of]: Principal | ICD-10-CM

## 2023-09-27 DIAGNOSIS — K5909 Other constipation: Principal | ICD-10-CM

## 2023-09-27 NOTE — Unmapped (Signed)
 Patient left without getting labs drawn. Called and left her a message to come and have her labs drawn and to make upcoming appointments. 2 months with [facility] and in 4 months with [facility].

## 2023-09-27 NOTE — Unmapped (Addendum)
 It was great seeing you in clinic today! Please see below for an overview of what we discussed:     Plan:     Stop phentermine.     Stop Wegovy. One the day you would typically be due for your next injection of Wegovy, start Zepbound instead. You never want to take two doses in one week. Follow the titration schedule listed below.    Since you have been on Wegovy, we can start at a slightly higher dose of Zepbound.     Plan: Zepbound/(Tirzepatide): Start 7.5 mg subcutaneous every week x 4 weeks. Afterwards, we will plan to titrate then to 10 mg subcutaneous every week for 4 weeks, then increase to 12.5 mg every week for 4 weeks, then increase to max dose of 15 mg subcutaneous every week.       Elevated blood pressure:   Your blood pressure goal is < 140/<90. Eating less salt and being physically active can improve your blood pressure. Follow up for a blood pressure recheck.      Recommendations from AI:   VISIT SUMMARY:  During your visit, we discussed your weight management progress, medication review, and several health concerns. You have experienced significant weight loss. We reviewed your current medications and discussed changes to help manage your weight and improve your overall health.    YOUR PLAN:  -BMI 34: You have lost significant weight  We will switch from [facility] to Zepbound, starting at 7.5 mg once weekly, pending insurance approval. This change aims to maintain your weight loss with potentially fewer gastrointestinal side effects. We will discontinue phentermine due to its contribution to constipation and hypertension. It's important to increase your water intake and maintain a balanced diet with fruits and vegetables. We can refer you to our nutritionist if you would like.    -CONSTIPATION: Constipation is a condition where you have infrequent bowel movements. You have been experiencing chronic constipation since your surgery. To help manage this, increase your water intake to at least three cups per day and consume more fruits and vegetables. We will discontinue phentermine to reduce the risk of constipation. Discuss your bowel movement frequency with your primary care provider to develop a management plan, which may include stool softeners or miralax-Call today.    -VITAMIN D DEFICIENCY: Vitamin D deficiency means you have lower than normal levels of vitamin D, which is important for bone health. You are currently taking 50,000 units once weekly for 12 weeks. We will check your vitamin D levels and consider switching to a bariatric vitamin with vitamin D if your levels are adequate.        -ELEVATED BILIRUBIN: Follow up with your primary care provider to recheck your bilirubin levels.        INSTRUCTIONS:  Please schedule a follow-up appointment in two months to assess your progress with Zepbound and your overall health. Additionally, follow up with your primary care provider to recheck your bilirubin levels and discuss your bowel movement frequency to develop a management plan and to recheck your blood pressure.    You have reported a history of clotting issue during a prior procedure. Please discuss with your primary care provider.        Goal:  Health improvement  Things to think about to help me reach my goal:                   What are you going to do? Decrease your Tea size from large to medium.  Add a fruit to breakfast.  Add a veggie to lunch and dinner.   How and how much?    How frequent?    Barriers to success?    Solutions to barriers?      Please alert the clinic if you are having rapid weight loss, such as > 2 pounds/week.    Nutrition: Work on a goal of 15-30g of protein per meal. Many patients find an app to be a helpful tool to accomplish this (MyFitnessPal, LoseIt, Cronometer, etc).  We can refer your to our clinic nutritionist, Mellissa Sprinkles, if you have not already been seen.  Additional tips: Add more fruits and vegetables to meals    Physical activity: Try to be physically active most days of the week. Consider weight resistance activities twice weekly.  Explore the following website for instructional exercise resources: [email or web address]/[email or web address]    Sleep: Work on a sleep goal of 7-8 hours per night. We can provide you information on sleep hygiene.     Referrals: You will be contacted to schedule appointments for any referrals placed.     Refills: Please call your pharmacy a week before you need your next dose of medicine.    The medications used in our clinic are not considered safe in pregnancy. Please address any concerns with your primary care provider.    Urgent Concerns: You should be seen for worsening of your acute or chronic medical concerns. For Life Threatening Emergencies-- call 911 and go to the Emergency Department.     For afterhours and weekend concerns:   [facility] for after-hours nurse advice at [phone]    [facility]gent Care Locations:   [email or web address]    Lab results:    Labs: If you have labs drawn, you will be notified through the MyChart Portal when they have been reviewed.     [email or web address]    Re-check any abnormalities with your primary care provider.    Nutrition:       Copyright ?? 2011, [employer]. For more information about The Healthy Eating Plate, please see The Nutrition Source, Department of Nutrition, [employer], www.thenutritionsource.org, and [employer], www.health.UKRank.hu. (Source: FuelStrike.hu)     Follow up:   Plan on follow up at least every 3 months. Please make sure you are up to date with your primary care provider.    Bariatric vitamins:    Below is some information about several bariatric vitamins. ASK YOUR BARIATRIC CLINIC WHICH VITAMIN THEY RECOMMEND.    Multivitamin Options (Chewable)  These include higher levels of vitamin d and vitamin b-12. You do not need to take an additional supplement typically. Serving size Amount of Iron Where to purchase:   Bariatric Advantage Ultra Solo with Iron 1 45 mg [email or web address]   Celebrate     Celebrate One 88 (formerly MCR 45)     Celebrate Multi-Complete 60     Celebrate multivitamin Soft chew     1  2  2    45 mg  60 mg  None [email or web address]   OPURITY Bariatric Multi Chewable with 45 mg Iron 1 45 mg [email or web address]   [facility]     Bariatric multivitamin with 45 mg Iron 1 45 mg [email or web address]   OVER THE COUNTER OPTIONS:  (Need to take additional vitamin D and Vitamin B 12 with the OTC options).      Equate Children's Multivitamin Complete 2 36 mg [employer]/Online   Target brand Kid's Multivitamin Complete 2 36 mg Target/Online.  AVOID GUMMY MULTIVITAMINS. GUMMIES DO NOT HAVE ALL THE VITAMINS AND MINERALS YOU NEED.    This information was provided online by [facility] medicine, Vitamin and Mineral supplements for Bariatric Procedures, accessed on 01/21/23.  nutrition-suggested-vitamin-mineral-supplements.pdf ([email or web address]) : Site: [email or web address]    These are some examples of bariatric multivitamins:                 Tirzepatide/ZEPBOUND - ONCE A WEEK INJECTION     Dosing:   Start at 2.5 mg weekly, increasing monthly as tolerated up to 15 mg (injectable).                    Weight management medication:     1) Start 7.5 mg once a week for 4 weeks, then if feeling well (no nausea, vomiting, abdominal pain, diarrhea, constipation), then  2) Increase Zepbound from 7.5 to 10 mg once a week for 4 weeks, then if feeling well (no nausea, vomiting, abdominal pain, diarrhea, constipation), then  3) Increase Zepbound from 10 to 12.5 mg once a week for 4 weeks, then if feeling well (no nausea, vomiting, abdominal pain, diarrhea, constipation), then  4) Increase Zepbound from 12.5 to 15 mg once a week. Stay at this dose.    If at any dose, after 4 weeks if you are not feeling well- do not increase to the next highest dose. You can remain at a lower dose. Please notify the clinic ASAP of any concerns.         STORAGE  Prior to first use, store in refrigerator (36??F-46??F) until expiration date, do not freeze product   If needed, each single-dose pen can be kept at room temperature (59??F-86??F) for a total of 21 days    ADMINISTRATION  Single-use pen (1 pen = 1 dose) that already has needle attached. Sfter administration, discard into sharps container.  Inject subcutaneously in abdomen, thigh or upper arm   Rotate injection sites    MISSED DOSE Administer Zepbound as soon as possible within 4 days (96 hours) after missed dose. If >4 days have passed, skip missed dose and administer on next scheduled administration and resume once weekly dosing   You never want to take two doses in one week.   Contact the clinic if you miss 2 or more doses.   POTENTIAL SIDE EFFECTS Mild-moderate stomach upset, nausea, vomiting, constipation, diarrhea,  abdominal pain, GERD, fatigue, headache.   Usually resolves overtime     Strategies to Help Stomach Upset   Reduce meal size, more frequent meals, mindfulness to stop eating once full, avoid eating when not hungry, avoid high fat or spicy food, stay well hydrated    Contraindications:  History of pancreatitis, personal or family history of medullary thyroid cancer or MEN type 2, gastroparesis.  Caution with history of gallstones or retinopathy.   Additional information:  Patients undergoing elective surgery should hold tirzepatide seven days prior to surgery      Go to their website for additional information:  Dosing, How to Use & How to Inject Zepbound?? (tirzepatide) Pen & Vial ([email or web address])    Site: [email or web address]    Key points:    We are starting this as a long term medication.      There may be a decreased absorption of oral pills such as coumadin or birth control pills so consider a back up method of contraception or another method, such as an IUD. You may also consider adding condoms for at least 4 weeks after initiation  of Zepbound and for 4 weeks after each Zepbound dose escalation.     It is important to hydrate well on these medications.      If you have increased nausea, you can remain at the highest dose you can tolerate until you have your follow up visit. Notify the office for any concerns.    If you develop severe nausea, vomiting or abdominal pain, you should stop the medication and call our office. You will need to be seen urgently!     The most common side effects of this medication are nausea and acid reflux. This can be reduced with slow titration of the medication, eating smaller meals to gage how you will respond to the medication, and not eating before laying down.      Other common complaints are constipation, which can be reduced by making sure you are getting enough fiber and water. Adding an over the counter fiber supplement may help as well. Hydration is important. Because you get fuller easier, making sure you get enough water may prevent dehydration. This is especially important if you have any chronic kidney disease.     Gallstones can be associated with weight loss and these medications may also be associated with gallstones. You should be seen for right upper abdominal pain, vomiting, fever, loss of appetite or anything concerning to you.    Please remember, that like all weight loss medications, this medication is contraindicated in pregnancy. Please reach out to discuss prior to getting pregnant and continue to use birth control methods.       Gallstones can be associated with weight loss and these medications may also be associated with gallstones. You should be seen for right upper abdominal pain, fever, loss of appetite, nausea and vomiting, yellowing of your skin or eyes or anything concerning to you.     You should be seen right away for any signs of pancreatitis, including abdominal pain, vomiting, or severe stomach pain.      Zepbound Savings: Go to [employer]:     [email or web address]           Go to the website for further information. They have a new program and list their cash prices as:              Tirzepatide Auto-Injector (TIRZEPATIDE (DIABETES) - INJECTION)  This medicine is used for the following purposes:  diabetes Type 2  weight loss  Brand Name(s): Mounjaro  Instructions  This medicine is injected into the skin. Ask your doctor, nurse, or pharmacist where on your body this medicine can be injected and how to inject it.  Carefully follow the instructions for preparing this medicine before injection.  Always inspect the medicine before using.  The liquid should be clear or light yellow.  Do not use the medicine if it contains any particles or if it has changed color.  Store new medicine in the refrigerator until you are ready to use it. Do not allow them to freeze.  Keep the medicine in its original container.  If needed, this medicine may be stored at room temperature for 21 days or fewer.  Protect medicine from light.  Never use any medicine that has expired.  Discard any remaining medicine after your dose is given.  Please ask your doctor, nurse, or pharmacist how to discard unused medicines safely.  You should hear 2 clicks when you inject the medicine. This is normal.  Change the location of the injection each time. Choose a  location at least 1 inch from the last injection.  Do not rub or massage the area where the injection was given.  If you forget to use a dose on time, use it as soon as you remember. If it has been more than 4 days, skip the missed dose and use your next dose as scheduled. Do not use 2 doses of this medicine at one time.  Drug interactions can change how medicines work or increase risk for side effects. Tell your health care providers about all medicines taken. Include prescription and over-the-counter medicines, vitamins, and herbal medicines. Speak with your doctor or pharmacist before starting or stopping any medicine.  Tell your doctor if symptoms do not get better or if they get worse.  This medicine may cause low blood sugar. Eat regular meals and exercise as instructed by your doctor. Tell your doctor if you have symptoms of low blood sugar such as nausea, sweating, cold skin, fast heartbeat, hunger, and irritability.  This medicine can make birth control pills work less well. Ask your doctor or pharmacist how long you should use an extra form of birth control, such as condoms.  Keep all appointments for medical exams and tests while on this medicine.  Do not use more than 1 dose each week.  Cautions  During pregnancy, this medicine should be used only when clearly needed. Talk to your doctor about the risks and benefits.  Tell your doctor and pharmacist if you ever had an allergic reaction to a medicine.  This medicine may increase the risk of cancer. Ask your doctor about the benefits and risks.  Monitor your blood sugar as instructed by your doctor.  Your ability to stay alert or to react quickly may be impaired by this medicine. Do not drive or operate machinery until you know how this medicine will affect you.  Please check with your doctor before drinking alcohol while on this medicine.  It is unknown if this medicine passes into breast milk. Ask your doctor before breastfeeding.  Always carry an ID card or wear a medical alert bracelet showing that you are diabetic.  Carry glucose tablets or hard candy with you in case you experience low blood sugar from this medicine.  Ask your pharmacist how to properly throw away used needles or syringes.  Do not share this medicine with anyone who has not been prescribed this medicine.  Some patients have serious side effects from this medicine. Ask your pharmacist to show you the information from the Food and Drug Administration (FDA) and discuss it with you.  Side Effects  The following is a list of some common side effects from this medicine. Please speak with your doctor about what you should do if you experience these or other side effects.  decreased appetite  constipation or diarrhea  lack of energy and tiredness  pain, redness, swelling near injection  nausea and vomiting  stomach upset or abdominal pain  Call your doctor or get medical help right away if you notice any of these more serious side effects:  unusual or long-lasting hoarseness  signs of kidney damage (such as change in urine color or bubbly urine)  unusual growth or lump on the neck  severe stomach pain that spreads to the back  difficulty swallowing  blurring or changes of vision  severe or persistent vomiting  A few people may have an allergic reaction to this medicine. Symptoms can include difficulty breathing, skin rash, itching, swelling, or severe dizziness. If  you notice any of these symptoms, seek medical help quickly.  Please speak with your doctor, nurse, or pharmacist if you have any questions about this medicine.  IMPORTANT NOTE: This document tells you briefly how to take your medicine, but it does not tell you all there is to know about it. Your doctor or pharmacist may give you other documents about your medicine. Please talk to them if you have any questions. Always follow their advice.  There is a more complete description of this medicine available in [place]. Scan this code on your smartphone or tablet or use the web address below. You can also ask your pharmacist for a printout. If you have any questions, please ask your pharmacist.  The display and use of this drug information is subject to Terms of Use.  More information about TIRZEPATIDE (DIABETES) - INJECTION      Copyright(c) 2023 [employer]  Selected from data included with permission and copyright by [employer] This copyrighted material has been downloaded from a licensed data provider and is not for distribution, except as may be authorized by the applicable terms of use.  Conditions of Use: The information in this database is intended to supplement, not substitute for the expertise and judgment of healthcare professionals. The information is not intended to cover all possible uses, directions, precautions, drug interactions or adverse effects nor should it be construed to indicate that use of a particular drug is safe, appropriate or effective for you or anyone else. A healthcare professional should be consulted before taking any drug, changing any diet or commencing or discontinuing any course of treatment. The display and use of this drug information is subject to express Terms of Use.  Care instructions adapted under license by [facility]. If you have questions about a medical condition or this instruction, always ask your healthcare professional. Healthwise, Incorporated disclaims any warranty or liability for your use of this information.         Injection site information:   Please ask if you have any questions!          Learning About Safe Needle Use and Disposal  Why is safe needle use important?     Safe use of needles (and other sharps, such as lancets) lowers your risk of getting or spreading a serious disease. Some diseases that can be passed through unsafe needle use include hepatitis B, hepatitis C, and HIV.  How can you use needles safely?  Use one needle and syringe for one person, one time.   Refill your supplies before you run out.   Get what you need from your doctor or pharmacy before your supplies get too low. That way, you won't risk running out.  Dispose of used needles, syringes, and other sharps safely.   Use a sharps disposal container. Put needles and syringes into the container right away after you use them. Don't overfill the container. And never put your fingers or hands inside of it.  Why is it important to get rid of sharps safely?  Used needles, syringes, and lancets (sharps) can be dangerous to people and pets. When sharps aren't disposed of correctly, others can get poked. Used sharps can spread diseases and cause injury and infection.  How can you get rid of sharps safely?  Always put used sharps in a heavy-duty plastic container.   A container made to hold used sharps is best. You can get one from your doctor, a medical supplier, a drugstore, or  online. If you don't have a special sharps container, you can use some types of household containers. They need to be sealed and labeled when you're done with them. Choose one that:  Stays upright and doesn't leak.  Is made of very strong plastic, such as a laundry detergent bottle.  Has a strong, tight-fitting lid.  Carefully put your sharps in the container right after use.   Don't force them in. Never put your fingers or hands inside the container.  Don't overfill the container.   Stop at the fill line, if your container has one. If it doesn't, stop when the container is three-fourths full.  Never put used sharps in the recycling bin, trash, or toilet.   Sharps can't be recycled. Neither can sharps containers. Loose sharps put in the trash can hurt waste workers.  Follow your area's rules for getting rid of your sharps container.   Some areas have laws requiring special disposal of sharps containers. In other areas, you can throw them away in your household trash. Check JokeRule.co.uk for rules in your area.  Current as of: November 13, 2021  Content Version: 14.1  ?? 2006-2024 Healthwise, Incorporated.   Care instructions adapted under license by [facility]. If you have questions about a medical condition or this instruction, always ask your healthcare professional. Healthwise, Incorporated disclaims any warranty or liability for your use of this information.

## 2023-09-27 NOTE — Unmapped (Signed)
 Frances Hernandez is a 48 y.o. female who presents with the following Obesity Related complications (Elevated blood pressures, intermittent) for medical weight loss follow up. 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    Body mass index is 34.75 kg/m??./ Current AOB Medications: metformin XR 1000 mg daily, topiramate 100 mg daily    Wegovy 2.4 mg once a week (1st dose Saturday, 03/09/23), phentermine 37.5      Plan: Stop phentermine. Stop wegovy. Continue metformin xr 1000 mg daily and topamax 100 mg daily.   Zepbound: Start 7.5 mg weekly for 4 weeks, then increase to 10 mg weekly for 4 weeks, then increase to 12.5 mg weekly for 4 weeks, then increase to 15 mg weekly as tolerated. Titrate as tolerated.     Reviewed treatment options.    Caution with the following medications: Related to history of hypertension, Caution: Phentermine (Adipex), Qsymia, Bupropion (Wellbutrin), Naltrexone, Contrave.    Treatment of interest: lifestyle and pharmacologic options.     Treatment Course:      Date: Weight   (lbs) BMI   (kg/m2) Treatment  Change   (lbs)    Change   %  Change from   Initial Visit   (lbs) % Change  from   Initial Visit Comments:     Initial   Visit   232 lbs Initial   Visit  Metformin   X x X X    12/10/19 (233 lb BMI 39.97 Metformin 1000  Start wegovy        03/17/20  (209 lb  BMI 35.96 Metformin 1000  Ozempic 1  Topamax 50        06/16/20 (194 lb  BMI 35.62 Metformin 1000  Wegovy 2.4  Topamax 50        11/03/20  (176 lb  BMI 32.22 Metformin 1000  Wegovy 2.4  Topamax 50        05/11/21 (180 lb) BMI 31.89  Metformin 1000  Wegovy 2.4  Topamax 50        10/24/21  (189 lb) BMI 34.57 Metformin 1000  Wegovy 2.4  Topamax 50        05/02/22  (176 lb) BMI 31.18  Metformin 1000  Wegovy 2.4  Topamax 25        08/29/22 (208 lb  BMI 35.82  Metformin 1000  Wegovy 2.4  Topamax 100  Phentermine 37.5        11/21/22 telehealth  Metformin 1000  Restart wegovy.  Topamax 100  Phentermine 37.5        03/11/23  (199 lb) BMI 36.99  Metformin 1000  Wegovy 2.4.  Topamax 100  Phentermine 37.5        07/22/23  (192 lb  BMI 35.26 metformin XR 1000 mg daily   - topiramate 100 mg daily   - Wegovy 2.4 mg once a week (1st dose Saturday, 03/09/23)  - phentermine 37.5 mg daily => not taking         09/27/23 190 lbs 34.75 metformin XR 1000 mg daily   - topiramate 100 mg daily   - Wegovy 2.4 mg once a week (1st dose Saturday, 03/09/23)->dc.  - phentermine 37.5 mg daily->dc  Start zepbound 7.5 mg. -2 lbs -1.04% -42 lbs -18.10%       Bariatric Surgery:   s/p sleeve gastrectomy with Dr. Emilie Harden in 2016. The BMI prior to surgery was 56 and a nadir BMI after surgery was 32.    Pt is taking Women's One a Day multivitamin 1 pill once a day     Diet Recommendations: Add more fruits and vegetables to meals. Decrease tea size. Increase water.  Pt was offered a referral to nutritionist Melissa Cranford.  Pt could consider a future referral to  family medicine appetite awareness counseling.  Nutrition counseling and education offered included:  general nutrition guidance and education.     At initial appointment, clinic recommended 500 calorie reduced diet.     Physical Activity / Exercise: Pt is very active, getting 13000-15000 steps/day and she recently added yoga. Pt has been given a link to [place] exercises.  Discussed importance of physical activity in weight maintenance.   Exercise counseling and education offered included: physical activity assessment    Sleep/Stress management:    Encouraged to aim for 8 hours of sleep per night. Aim to go to sleep nightly at the same time, limit screen time 1 hour before bed.        Labs ordered: Vitamin levels     AOMs are contraindicated in pregnancy.  Contraception:  tubal ligation

## 2023-10-01 MED FILL — ZEPBOUND 7.5 MG/0.5 ML SUBCUTANEOUS PEN INJECTOR: SUBCUTANEOUS | 28 days supply | Qty: 2 | Fill #0

## 2023-10-01 MED FILL — TOPIRAMATE 100 MG TABLET: ORAL | 90 days supply | Qty: 90 | Fill #1

## 2023-10-01 MED FILL — METFORMIN ER 500 MG TABLET,EXTENDED RELEASE 24 HR: ORAL | 90 days supply | Qty: 180 | Fill #1

## 2023-10-04 NOTE — Unmapped (Signed)
 PA for Zepbound approved via fax through 03/31/2024  Approval letter uploaded to the chart.

## 2023-10-11 ENCOUNTER — Ambulatory Visit: Admit: 2023-10-11 | Discharge: 2023-10-12 | Payer: BLUE CROSS/BLUE SHIELD

## 2023-10-11 DIAGNOSIS — Z903 Acquired absence of stomach [part of]: Principal | ICD-10-CM

## 2023-10-11 LAB — IRON & TIBC
IRON SATURATION: 23 % (ref 20–55)
IRON: 69 ug/dL (ref 50–170)
TOTAL IRON BINDING CAPACITY: 299 ug/dL (ref 250–425)

## 2023-10-11 LAB — VITAMIN B12: VITAMIN B-12: 256 pg/mL (ref 211–911)

## 2023-10-11 LAB — FOLATE: FOLATE: 6.1 ng/mL (ref >=5.4)

## 2023-10-16 LAB — VITAMIN D 25 HYDROXY: VITAMIN D, TOTAL (25OH): 15.8 ng/mL — ABNORMAL LOW (ref 20.0–80.0)

## 2023-10-17 DIAGNOSIS — J4521 Mild intermittent asthma with (acute) exacerbation: Principal | ICD-10-CM

## 2023-10-17 LAB — VITAMIN B6: VITAMIN B6: 4 ug/L — ABNORMAL LOW

## 2023-10-17 LAB — VITAMIN B1, WHOLE BLOOD: VITAMIN B1: 107 nmol/L

## 2023-10-17 MED ORDER — BUDESONIDE-FORMOTEROL HFA 80 MCG-4.5 MCG/ACTUATION AEROSOL INHALER
Freq: Two times a day (BID) | RESPIRATORY_TRACT | 11 refills | 31.00000 days | Status: CP
Start: 2023-10-17 — End: 2023-10-17

## 2023-10-17 NOTE — Unmapped (Signed)
 Patient is requesting the following refill  Requested Prescriptions     Pending Prescriptions Disp Refills    budesonide-formoterol (SYMBICORT) 80-4.5 mcg/actuation inhaler 10.2 g 11     Sig: Inhale 2 puffs two (2) times a day.       Recent Visits  Date Type Provider Dept   08/12/23 Telemedicine Straub, Pura Browns, MD [facility] Family Medicine [facility] [place]   07/02/23 Office Visit Geoffery Kiel, MD [facility] Family Medicine [address] At [facility]   06/20/23 Office Visit Crites, Hermann Look, MD [place] [facility] [address] At [facility]   06/12/23 Office Visit Straub, Pura Browns, MD [facility] Family Medicine [facility] [place]   03/29/23 Office Visit Geoffery Kiel, MD [facility] Family Medicine [address] At [facility]   03/14/23 Office Visit Crites, Hermann Look, MD [place] [facility] [address] At [facility]   03/11/23 Office Visit Straub, Pura Browns, MD [facility] Family Medicine [facility] [place]   02/18/23 Office Visit Geoffery Kiel, MD [facility] Family Medicine [address] At [facility]   02/05/23 Office Visit Straub, Pura Browns, MD [facility] Family Medicine [facility] [place]   01/21/23 Office Visit Monia Anis, MD [place] [facility] [address] At [facility]   Showing recent visits within past 365 days and meeting all other requirements  Future Appointments  Date Type Provider Dept   11/15/23 Appointment Straub, Pura Browns, MD  Family Medicine [facility]   Showing future appointments within next 365 days and meeting all other requirements       Labs: Vitals:   BP Readings from Last 3 Encounters:   09/27/23 148/98   07/22/23 113/80   07/02/23 140/80    and   Pulse Readings from Last 3 Encounters:   09/27/23 77   07/22/23 82   07/02/23 99

## 2023-10-31 ENCOUNTER — Inpatient Hospital Stay: Admit: 2023-10-31 | Discharge: 2023-11-01 | Payer: BLUE CROSS/BLUE SHIELD

## 2023-10-31 MED FILL — ZEPBOUND 10 MG/0.5 ML SUBCUTANEOUS PEN INJECTOR: SUBCUTANEOUS | 28 days supply | Qty: 2 | Fill #0

## 2023-11-08 ENCOUNTER — Ambulatory Visit: Admit: 2023-11-08 | Discharge: 2023-11-09 | Payer: BLUE CROSS/BLUE SHIELD

## 2023-11-08 DIAGNOSIS — G4710 Hypersomnia, unspecified: Principal | ICD-10-CM

## 2023-11-08 LAB — CBC
HEMATOCRIT: 35.7 % (ref 34.0–44.0)
HEMOGLOBIN: 11.8 g/dL (ref 11.3–14.9)
MEAN CORPUSCULAR HEMOGLOBIN CONC: 33 g/dL (ref 32.0–36.0)
MEAN CORPUSCULAR HEMOGLOBIN: 28.6 pg (ref 25.9–32.4)
MEAN CORPUSCULAR VOLUME: 86.6 fL (ref 77.6–95.7)
MEAN PLATELET VOLUME: 9.8 fL (ref 6.8–10.7)
PLATELET COUNT: 237 10*9/L (ref 150–450)
RED BLOOD CELL COUNT: 4.12 10*12/L (ref 3.95–5.13)
RED CELL DISTRIBUTION WIDTH: 13.8 % (ref 12.2–15.2)
WBC ADJUSTED: 7.3 10*9/L (ref 3.6–11.2)

## 2023-11-08 LAB — TSH: THYROID STIMULATING HORMONE: 0.913 u[IU]/mL (ref 0.550–4.780)

## 2023-11-08 NOTE — Unmapped (Signed)
 [facility] Family Medicine [facility] Visit    Assessment and Plan:     Assessment & Plan  Excessive daytime sleepiness  Frances Hernandez presents with sudden onset of severe daytime sleepiness approximately 2 weeks ago. Only notable change at that time was cessation of her phentermine due to switch of medication for weight loss to [facility]. Could be from stimulant withdrawal given this. Other causes like anemia and thyroid dysfunction less likely but will check with labs today. Also consider OSA if no other etiology identified.   - Order CBC and thyroid function tests.  - Instruct to take one phentermine pill on Monday and report back on energy levels; if has resolution of symptoms, then they were likely from stimulant withdrawal  - Advise hydration and adequate protein intake.  - Consider sleep study if symptoms persist.    Perimenopause  Symptoms consistent with perimenopause, possibly exacerbated by phentermine cessation.  - Continue norethindrone (managed by Gyn) given return of cramping with cessation of medication  - Monitor symptoms and consider hormone therapy if symptoms worsen and if risk/ benefit ratio appropriate    Assessment & Plan  Excessive sleepiness    Orders:    TSH    CBC    F/u: she will message me Monday after her trial of one dose of phentermine  HEALTH MAINTENANCE ITEMS STILL DUE:  Health Maintenance Due   Topic Date Due    Hepatitis C Screen  Never done         Subjective:     Chief Complaint   Patient presents with    Follow-up     CONCERNS: constantly tired, falling asleep easily      History of Present Illness  Frances Hernandez is a 48 year old female who presents with persistent sleepiness and fatigue.    She has been experiencing persistent sleepiness and fatigue for the past couple of weeks. Despite sleeping from 7 PM to 5:30 AM, she feels tired by 11 AM the next day. She takes naps during lunch breaks and often goes to sleep soon after getting home from work. This sleepiness has impacted her ability to drive, requiring her to let someone else take over after just an hour of driving.    There has been a recent change in her medication regimen. She stopped taking phentermine and metformin at the beginning of May and started taking Zepbound around the same time. She had been on phentermine for about two years, which she acknowledges gave her energy. She also takes norethindrone for menstrual cycle management, which has stopped her periods, and reports that her hot flashes have ceased. She takes topiramate at night for migraines, which is not a new medication for her.    Her mood has been more irritable and aggressive over the past month, and she sometimes experiences early morning awakenings, although not consistently. No feelings of sadness or hopelessness. She has been taking bariatric vitamins and vitamin D. She reports a weight loss of seven pounds over six weeks, which she attributes to her current dietary habits, including consuming protein drinks and smoothies. She notes difficulty eating large pieces of meat, feeling like she is gagging, and prefers smaller portions or different types of protein like shrimp.    No recent illness such as a cold or sore throat. Occasional early morning awakenings but no consistent sleep disturbances. Increased irritability noted. Not taking any over-the-counter medications or allergy medicines currently.    I have reviewed the patient's problem list, current medications,  allergies, medical history, family history, and social history and updated them as needed.    Objective:     Vitals:    11/08/23 1344   BP: 120/85   Pulse: 93   Temp: 36.8 ??C (98.3 ??F)     BP Readings from Last 3 Encounters:   11/08/23 120/85   09/27/23 148/98   07/22/23 113/80     Wt Readings from Last 3 Encounters:   11/08/23 83.4 kg (183 lb 12.8 oz)   09/27/23 86.2 kg (190 lb)   07/22/23 87.5 kg (192 lb 12.8 oz)     Physical exam:  Physical Exam  Well appearing, no acute distress, conversant, holds a conversation with no issues. No yawning or other signs of sleepiness during visit    Results:  Results  LABS  CBC: Within normal limits (02/2023)  Thyroid function tests: Within normal limits (03/2023)

## 2023-11-12 DIAGNOSIS — G4719 Other hypersomnia: Principal | ICD-10-CM

## 2023-11-12 MED ORDER — MODAFINIL 100 MG TABLET
ORAL_TABLET | Freq: Every day | ORAL | 1 refills | 30.00000 days | Status: CP
Start: 2023-11-12 — End: ?
  Filled 2023-11-18: qty 30, 30d supply, fill #0

## 2023-11-18 MED FILL — ERGOCALCIFEROL (VITAMIN D2) 1,250 MCG (50,000 UNIT) CAPSULE: ORAL | 84 days supply | Qty: 12 | Fill #1

## 2023-11-19 NOTE — Unmapped (Unsigned)
 I responded to the pt in the portal.

## 2023-11-22 MED ORDER — ZEPBOUND 7.5 MG/0.5 ML SUBCUTANEOUS PEN INJECTOR
SUBCUTANEOUS | 0 refills | 0.00000 days | Status: CP
Start: 2023-11-22 — End: 2023-12-14
  Filled 2023-11-28: qty 2, 28d supply, fill #0

## 2023-12-10 MED ORDER — NORETHINDRONE ACETATE 5 MG TABLET
ORAL_TABLET | Freq: Every day | ORAL | 3 refills | 90.00000 days | Status: CP
Start: 2023-12-10 — End: 2024-03-09
  Filled 2024-01-07: qty 45, 90d supply, fill #0

## 2023-12-20 MED ORDER — ZEPBOUND 10 MG/0.5 ML SUBCUTANEOUS PEN INJECTOR
SUBCUTANEOUS | 0 refills | 0.00000 days | Status: CP
Start: 2023-12-20 — End: 2024-01-11
  Filled 2024-01-07: qty 2, 28d supply, fill #0
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
## 2023-12-28 DIAGNOSIS — Z9884 Bariatric surgery status: Principal | ICD-10-CM

## 2023-12-28 MED ORDER — ERGOCALCIFEROL (VITAMIN D2) 1,250 MCG (50,000 UNIT) CAPSULE
ORAL_CAPSULE | 1 refills | 0.00000 days
Start: 2023-12-28 — End: ?

## 2023-12-31 MED ORDER — ERGOCALCIFEROL (VITAMIN D2) 1,250 MCG (50,000 UNIT) CAPSULE
ORAL_CAPSULE | ORAL | 1 refills | 0.00000 days | Status: CP
Start: 2023-12-31 — End: ?
  Filled 2024-02-05: qty 12, 84d supply, fill #0

## 2024-01-06 NOTE — Unmapped (Signed)
 Call to reschedule appt with Dr Rogelio from 8/27 to 8/25 per Provider bump
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
## 2024-01-07 MED FILL — MODAFINIL 100 MG TABLET: ORAL | 30 days supply | Qty: 30 | Fill #1

## 2024-01-08 ENCOUNTER — Inpatient Hospital Stay: Admit: 2024-01-08 | Discharge: 2024-01-08 | Payer: BLUE CROSS/BLUE SHIELD

## 2024-01-08 DIAGNOSIS — M542 Cervicalgia: Principal | ICD-10-CM

## 2024-01-08 DIAGNOSIS — M25552 Pain in left hip: Principal | ICD-10-CM

## 2024-01-08 MED ORDER — TIZANIDINE 4 MG TABLET
ORAL_TABLET | Freq: Three times a day (TID) | ORAL | 1 refills | 20.00000 days | Status: CP | PRN
Start: 2024-01-08 — End: 2025-01-07

## 2024-01-08 NOTE — Unmapped (Signed)
 Please go directly to [address] for an xray of your neck

## 2024-01-08 NOTE — Unmapped (Signed)
 Assessment and Plan:     Assessment & Plan  Neck pain, cervical   Acute neck pain after looking up and laterally. Now with audible crepitus. Differential includes muscle strain, subluxation, or fracture. No arm numbness or tingling.  - Order cervical spine x-rays  - Advise avoiding excessive neck turning and popping.  - Tizanidine  prescribed for muscle spasms/tension  - Offered TPI/steroids, patient deferred for now pending response   - Can follow up anytime for targeted injection if desired      Return if symptoms worsen or fail to improve.    Subjective:     HPI: Frances Hernandez is a 48 y.o. female here for Neck Pain (Patient is having 8/10. Patients neck is making a loud popping sound when she turn her head from side to side./Patient also slipped and hit left hip yesterday.)    History of Present Illness  Frances Hernandez is a 48 year old female who presents with neck pain following a recent injury.    She began experiencing neck pain on Saturday, initially described as a mild ache. She applied a heating pad for relief. The following day, while turning to retrieve something from a cabinet, she felt a 'pop' in her neck, followed by a sensation of 'sandpapering' in the neck area.    She slipped on wet steps during her lunch break, resulting in a fall where she hit her back. Following this incident, she contacted her primary care provider but was unable to secure an appointment until September 8th, prompting her to seek care here as an overbook.    The neck pain worsens with movement, particularly when turning her head. No numbness or tingling in the shoulders or arms.    She has been using a heating pad at night, rolling it up to use as a pillow for support. No other changes or symptoms beyond the popping noise, pain.     Acute visit for neck pain 8/10 pain  Has an audible crepitus   Feels like she hears a grinding pain when she rotates  Slipped and fell in the rain after this and hurt left hip     All review of symptoms negative unless noted in HPI.    Objective:     Vitals:    01/08/24 0753   BP: 142/96   BP Site: L Arm   BP Position: Sitting   BP Cuff Size: Large   Pulse: 78   Temp: 36.7 ??C (98.1 ??F)   TempSrc: Temporal   SpO2: 100%     There is no height or weight on file to calculate BMI.  Physical Exam:  General: Well developed. Appears uncomfortable   HEENT:  Normocephalic.  Atraumatic.   Neck:  guarded movement. Focally TTP at base of skull in suboccipital region. Also with increased tension of paracervical neck muscles. Audible crepitus noted with rotation of neck.   Heart:  Regular rate and rhythm.    Lungs:  No respiratory distress.    Skin:  Warm, dry, no rashes  Neuro:  Non-focal. Sensation intact in the BLUE. Gait normal.   Psych:  Affect normal, answers questions appropriately, eye contact good, speech clear and coherent.      Current Medications:     Current Medications[1]      Con JAYSON Dawn, MD        Note - This record has been created using Dragon software/Abridge AI software. Chart creation errors have been sought, but may not always have  been located. Such creation errors do not reflect on the standard of medical care.         [1]   Current Outpatient Medications   Medication Sig Dispense Refill    albuterol  HFA 90 mcg/actuation inhaler Inhale 2 puffs every four (4) hours as needed for wheezing or shortness of breath. 8.5 g 11    budesonide -formoterol  (SYMBICORT ) 80-4.5 mcg/actuation inhaler Inhale 2 puffs two (2) times a day. 10.2 g 11    busPIRone  (BUSPAR ) 5 MG tablet Take 1 tablet (5 mg total) by mouth every eight (8) hours as needed. 20 tablet 1    diclofenac  sodium (VOLTAREN ) 1 % gel Apply 2 g topically four (4) times a day. 350 g 0    ergocalciferol -1,250 mcg, 50,000 unit, (DRISDOL ) 1,250 mcg (50,000 unit) capsule Take one pill by mouth once a week for 12 weeks, then decrease to one pill every two weeks 12 capsule 1    fluticasone  propionate (FLONASE ) 50 mcg/actuation nasal spray Instill 2 sprays into each nostril daily. 16 g 11    metFORMIN  (GLUCOPHAGE -XR) 500 MG 24 hr tablet Take 2 tablets (1,000 mg total) by mouth daily with evening meal. (Patient not taking: Reported on 11/08/2023) 180 tablet 2    modafinil  (PROVIGIL ) 100 MG tablet Take 1 tablet (100 mg total) by mouth daily. 30 tablet 1    montelukast  (SINGULAIR ) 10 mg tablet Take 1 tablet (10 mg total) by mouth nightly. 90 tablet 3    norethindrone  (AYGESTIN ) 5 mg tablet Take 0.5 tablets (2.5 mg total) by mouth daily. 45 tablet 3    ondansetron  (ZOFRAN ) 4 MG tablet Take 1 tablet (4 mg total) by mouth every eight (8) hours as needed for nausea. 20 tablet 0    tirzepatide  (ZEPBOUND ) 10 mg/0.5 mL injection pen Inject 10 mg under the skin every seven (7) days for 4 doses. 2 mL 0    [START ON 02/14/2024] tirzepatide , weight loss, (ZEPBOUND ) 15 mg/0.5 mL injection pen Inject 15 mg under the skin every seven (7) days. 2 mL 5    tizanidine  (ZANAFLEX ) 4 MG tablet Take 1 tablet (4 mg total) by mouth every eight (8) hours as needed (for muscle spasms). 60 tablet 1    topiramate  (TOPAMAX ) 100 MG tablet Take 1 tablet (100 mg total) by mouth nightly. 90 tablet 3    triamcinolone  (KENALOG ) 0.1 % cream APPLY A THIN LAYER TO THE AFFECTED AREA UNTIL FLAT AND NO LONGER ITCHY FOR A MAX OF 2 WEEKS ON THE FACE AND 4 WEEKS ON HANDS AND FEET PER FLARE (Patient not taking: Reported on 11/08/2023)      venlafaxine  (EFFEXOR  XR) 37.5 MG 24 hr capsule Take 1 capsule (37.5 mg total) by mouth daily. 30 capsule 2    vit B complx-folic ac-C-biotin 1 mg-100 mg- 300 mcg Tab        No current facility-administered medications for this visit.

## 2024-01-09 ENCOUNTER — Ambulatory Visit: Admit: 2024-01-09 | Discharge: 2024-01-10 | Payer: BLUE CROSS/BLUE SHIELD | Attending: Family | Primary: Family

## 2024-01-09 DIAGNOSIS — Z6834 Body mass index (BMI) 34.0-34.9, adult: Principal | ICD-10-CM

## 2024-01-09 DIAGNOSIS — Z6831 Body mass index (BMI) 31.0-31.9, adult: Principal | ICD-10-CM

## 2024-01-09 DIAGNOSIS — R030 Elevated blood-pressure reading, without diagnosis of hypertension: Principal | ICD-10-CM

## 2024-01-09 MED ORDER — ZEPBOUND 10 MG/0.5 ML SUBCUTANEOUS PEN INJECTOR
SUBCUTANEOUS | 3 refills | 28.00000 days | Status: CP
Start: 2024-01-09 — End: 2024-02-06
  Filled 2024-01-10: qty 2, 28d supply, fill #0

## 2024-01-09 NOTE — Unmapped (Addendum)
 Frances Hernandez is a 48 y.o. female who presents with the following Obesity Related complications (Elevated blood pressures, intermittent) for medical weight loss follow up. 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    Body mass index is 31.16 kg/m??. Current AOB Medications: , topiramate  100 mg daily  (migraines), zepbound  15 mg weekly.    Plan:  Zepbound : Decrease to 10 mg weekly r/t lower appetite. Titrate as needed.    Caution with the following medications: Related to history of hypertension, Caution: Phentermine  (Adipex), Qsymia, Bupropion (Wellbutrin), Naltrexone, Contrave.    Treatment of interest: lifestyle and pharmacologic options.     Treatment Course:      Date: Weight   (lbs) BMI   (kg/m2) Treatment  Change   (lbs)    Change   %  Change from   Initial Visit   (lbs) % Change  from   Initial Visit Comments:     Initial   Visit   232 lbs Initial   Visit  Metformin    X x X X    12/10/19 (233 lb 39.97 Metformin  1000  Start wegovy         03/17/20  (209 lb  35.96 Metformin  1000  Ozempic  1  Topamax  50        06/16/20 (194 lb  35.62 Metformin  1000  Wegovy  2.4  Topamax  50        11/03/20  (176 lb  32.22 Metformin  1000  Wegovy  2.4  Topamax  50        05/11/21 (180 lb) 31.89  Metformin  1000  Wegovy  2.4  Topamax  50        10/24/21  (189 lb) 34.57 Metformin  1000  Wegovy  2.4  Topamax  50        05/02/22  (176 lb) 31.18  Metformin  1000  Wegovy  2.4  Topamax  25        08/29/22 (208 lb  35.82  Metformin  1000  Wegovy  2.4  Topamax  100  Phentermine  37.5        11/21/22 telehealth  Metformin  1000  Restart wegovy .  Topamax  100  Phentermine  37.5        03/11/23  (199 lb) 36.99  Metformin  1000  Wegovy  2.4.  Topamax  100  Phentermine  37.5        07/22/23  (192 lb  35.26 metformin  XR 1000 mg daily   - topiramate  100 mg daily   - Wegovy  2.4 mg once a week (1st dose Saturday, 03/09/23)  - phentermine  37.5 mg daily => not taking         09/27/23 190 lbs 34.75 metformin  XR 1000 mg daily   - topiramate  100 mg daily   - Wegovy  2.4 mg once a week (1st dose Saturday, 03/09/23)->dc.  - phentermine  37.5 mg daily->dc  Start zepbound  7.5 mg. -2 lbs -1.04% -42 lbs -18.10%    01/09/24 170 lbs 31.16 Topamax  100 mg (migraines).  Zepbound  15 mg weekly->10 mg. -20 lbs -10.53% -62 lbs -26.72%       Bariatric Surgery:   s/p sleeve gastrectomy with Dr. Librada in 2016. The BMI prior to surgery was 56 and a nadir BMI after surgery was 32.    Pt is taking  Women's One a Day multivitamin 1 pill once a day. Pt has started vitamin D  once a week. Pt may consider holding vitamin d  supplement and changing to a bariatric vitamin with vitamin D  (r/t low vitamin D , low b6 and low normal b12). If she decides to make this change, recommend vitamin levels 3 mo after change.      Diet Recommendations: Add more fruits and vegetables to meals. Decrease tea size. Applauded pt as she changed to 1/2 and 1/2 sweet tea and decreased to a medium sized tea. Increase water.  Pt was referred to nutritionist Melissa Cranford.  Pt could consider a future referral to  family medicine appetite awareness counseling.  Nutrition counseling and education offered included:  general nutrition guidance and education.         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   Pt has been given a link to [place] exercises.  Discussed importance of physical activity in weight maintenance.   Exercise counseling and education offered included: physical activity assessment    Sleep/Stress management:    Encouraged to aim for 8 hours of sleep per night. Aim to go to sleep nightly at the same time, limit screen time 1 hour before bed.        Labs ordered:     AOMs are contraindicated in pregnancy.  Contraception:  tubal ligation

## 2024-01-09 NOTE — Unmapped (Addendum)
 It was great seeing you in clinic today! Please see below for an overview of what we discussed:     Plan: Decrease zepbound  to 10 mg weekly.    Your blood pressure goal is < 140/<90. Eating less salt and being physically active can improve your blood pressure. Follow up for a blood pressure recheck.      Goal:  Health improvement  Things to think about to help me reach my goal:                   What are you going to do? Increase protein-add protein to breakfast.       How and how much?    How frequent?    Barriers to success?    Solutions to barriers?      Please alert the clinic if you are having rapid weight loss, such as > 2 pounds/week.    Nutrition: Work on a goal of 15-30g of protein per meal. Many patients find an app to be a helpful tool to accomplish this (MyFitnessPal, LoseIt, Cronometer, etc).  Our clinic nutritionist, Eleanor Necessary, is available for appointments. We also have an appetite awareness counselor that is available for appointments.  Additional tips: Add more fruits and vegetables to meals    Physical activity: Try to be physically active most days of the week. Consider weight resistance activities twice weekly.  Explore the following website for instructional exercise resources: https://uncwellness.com/programs/videos/    Sleep: Work on a sleep goal of 7-8 hours per night. We can provide you information on sleep hygiene.     Referrals: You will be contacted to schedule appointments for any referrals placed.     Refills: Please call your pharmacy a week before you need your next dose of medicine.    The medications used in our clinic are not considered safe in pregnancy. Please address any concerns with your primary care provider.    Urgent Concerns: You should be seen for worsening of your acute or chronic medical concerns. For Life Threatening Emergencies-- call 911 and go to the Emergency Department.     For after hours and weekend concerns:   [facility] for after-hours nurse advice at [phone]    [facility] Locations:   [email or web address]    Lab results:    Labs: If you have labs drawn, you will be notified through the MyChart Portal when they have been reviewed.     [email or web address]    Re-check any abnormalities with your primary care provider.    Nutrition:       Copyright ?? 2011, [employer]. For more information about The Healthy Eating Plate, please see The Nutrition Source, Department of Nutrition, [employer], www.thenutritionsource.org, and [employer], www.health.UKRank.hu. (Source: FuelStrike.hu)     Follow up:   Plan on follow up at least every 3 months. Please make sure you are up to date with your primary care provider.    Bariatric vitamins:    Below is some information about several bariatric vitamins. ASK YOUR BARIATRIC CLINIC WHICH VITAMIN THEY RECOMMEND.    Multivitamin Options (Chewable)  These include higher levels of vitamin d  and vitamin b-12. You do not need to take an additional supplement typically. Serving size Amount of Iron Where to purchase:   Bariatric Advantage Ultra Solo with Iron 1 45 mg [email or web address]   Celebrate     Celebrate One 78 (formerly MCR 45)     Celebrate Multi-Complete 60  Celebrate multivitamin Soft chew     1  2  2    45 mg  60 mg  None [email or web address]   OPURITY Bariatric Multi Chewable with 45 mg Iron 1 45 mg [email or web address]   [facility]     Bariatric multivitamin with 45 mg Iron 1 45 mg [email or web address]   OVER THE COUNTER OPTIONS:  (Need to take additional vitamin D  and Vitamin B 12 with the OTC options).      Equate Children's Multivitamin Complete 2 36 mg [employer]/Online   Target brand Kid's Multivitamin Complete 2 36 mg Target/Online.     AVOID GUMMY MULTIVITAMINS. GUMMIES DO NOT HAVE ALL THE VITAMINS AND MINERALS YOU NEED.    This information was provided online by [facility], Vitamin and Mineral supplements for Bariatric Procedures, accessed on 01/21/23.  nutrition-suggested-vitamin-mineral-supplements.pdf ([email or web address]) : Site: [email or web address]    These are some examples of bariatric multivitamins:                 Estimation of protein content (Per Google.com, accessed on 06/18/23):  Kidney beans: 1 cup, 43 gm protein  Baked beans: 1 cup, 14 gm protein  Pinto beans: 1 cup, 41 gm protein  Broccoli: 1 cup, 2.6 gm protein  Chickpeas: 1 cup, 39 gm protein  Edamame: 1 cup, 17 gm protein  Lentils: 1 cup, 18 gm protein  Nut butter: 2 tbsp*, 8 gm protein  Mixed nuts: 1 cup, 27 gm protein  Oats: 1 cup, 11 gm protein  Peas: 1 cup, 8 gm  protein  Quinoa: 1 cup cooked, 8 gm protein  Sorghum: 1 cup cooked 22 gm protein  Soy milk: 1 cup, 8 gm protein  Spinach: 1 cup, ~1 gm protein  Tempeh: 1 cup, 31 gm protein  Tofu: 1/2 cup*, 10 gm protein  Veggie pattie: 1 pattie, 11 gm protein                 Learning About the Mediterranean Diet  What is the Mediterranean diet?     The Mediterranean diet is a style of eating rather than a diet plan. It features foods eaten in [place], [place], southern [place] and [place], and other countries along the [employer]. It emphasizes eating foods like fish, fruits, vegetables, beans, high-fiber breads and whole grains, nuts, and olive oil. This style of eating includes limited red meat, cheese, and sweets.  Why choose the Mediterranean diet?  A Mediterranean-style diet may improve heart health. It contains more fat than other heart-healthy diets. But the fats are mainly from nuts, unsaturated oils (such as fish oils and olive oil), and certain nut or seed oils (such as canola, soybean, or flaxseed oil). These fats may help protect the heart and blood vessels.  How can you get started on the Mediterranean diet?  Here are some things you can do to switch to a more Mediterranean way of eating.  What to eat  Eat a variety of fruits and vegetables each day, such as grapes, blueberries, tomatoes, broccoli, peppers, figs, olives, spinach, eggplant, beans, lentils, and chickpeas.  Eat a variety of whole-grain foods each day, such as oats, brown rice, and whole wheat bread, pasta, and couscous.  Eat fish at least 2 times a week. Try tuna, salmon, mackerel, lake trout, herring, or sardines.  Eat moderate amounts of low-fat dairy products, such as milk, cheese, or yogurt.  Eat moderate amounts of poultry and eggs.  Choose healthy (unsaturated) fats, such as nuts, olive oil, and certain nut or  seed oils like canola, soybean, and flaxseed.  Limit unhealthy (saturated) fats, such as butter, palm oil, and coconut oil. And limit fats found in animal products, such as meat and dairy products made with whole milk. Try to eat red meat only a few times a month in very small amounts.  Limit sweets and desserts to only a few times a week. This includes sugar-sweetened drinks like soda.  The Mediterranean diet may also include red wine with your meal--1 glass each day for women and up to 2 glasses a day for men.  Tips for eating at home  Use herbs, spices, garlic, lemon zest, and citrus juice instead of salt to add flavor to foods.  Add avocado slices to your sandwich instead of bacon.  Have fish for lunch or dinner instead of red meat. Brush the fish with olive oil, and broil or grill it.  Sprinkle your salad with seeds or nuts instead of cheese.  Cook with olive or canola oil instead of butter or oils that are high in saturated fat.  Switch from 2% milk or whole milk to 1% or fat-free milk.  Dip raw vegetables in a vinaigrette dressing or hummus instead of dips made from mayonnaise or sour cream.  Have a piece of fruit for dessert instead of a piece of cake. Try baked apples, or have some dried fruit.  Tips for eating out  Try broiled, grilled, baked, or poached fish instead of having it fried or breaded.  Ask your server to have your meals prepared with olive oil instead of butter.  Order dishes made with marinara sauce or sauces made from olive oil. Avoid sauces made from cream or mayonnaise.  Choose whole-grain breads, whole wheat pasta and pizza crust, brown rice, beans, and lentils.  Cut back on butter or margarine on bread. Instead, you can dip your bread in a small amount of olive oil.  Ask for a side salad or grilled vegetables instead of french fries or chips.  Where can you learn more?  Go to [facility]Chart at https://myuncchart.Armed forces logistics/support/administrative officer in the Menu. Enter O407 in the search box to learn more about Learning About the Mediterranean Diet.  Current as of: February 21, 2022  Content Version: 14.1  ?? 2006-2024 Healthwise, Incorporated.   Care instructions adapted under license by [facility]. If you have questions about a medical condition or this instruction, always ask your healthcare professional. Healthwise, Incorporated disclaims any warranty or liability for your use of this information.      Bereavement Services at [facility]  Losing a loved one can be a very isolating experience. You may feel lonely, lost, and unsure of how to move forward. Our hospice bereavement support can help you cope with your grief and find your way back to a sense of normalcy.  We welcome anyone in the community who has lost a loved one, regardless of whether they received hospice services from us .  There is no charge for any of our services.     Grief Support Groups  We offer an in-person, closed support group format with 5-7 participants in each group. Our groups occur in the spring, summer, and fall and will meet for 8 consecutive weeks at the [facility] in [place]. Contact our Bereavement Coordinator, Elspeth Alamin, if you or a family member are interested in joining one of our grief support groups.  Afternoon Group  Meets on Wednesdays at 2 p.m. Next new group will begin  in May 2025.  Evening Group  Meets on Tuesdays at 6 p.m. Next new group will begin in May 2025.  Grief Counseling  We offer free, short-term grief counseling to help you navigate the initial wave of loss and develop coping skills. Additionally, we can connect you with valuable community resources for ongoing support, if needed.  Spiritual Care Services  Our clinical chaplains offer spiritual care and emotional support to family members from any faith or spiritual tradition. They provide guidance and counsel in:  Spiritual support  Compassionate listening  Care without judgment   Help in preparing for death  Help in memorial service planning  Grief counseling  Grief Workshops  We offer grief workshops at our [facility]. These workshops may include art therapy, writing, yoga, a book club, meditation, and more.  Mailings  For our bereaved families, we offer a 10-month grief support program that includes quarterly mailings of resources and information.  Annual Memorial Service  Once a year, families come together to remember and honor their loved ones. Held at a community facility, [facility] leads a service to remember and reflect on our loved ones.  If you are interested in any of the above services or activities, please contact us  at [phone].      Recommendations from AI:

## 2024-01-10 DIAGNOSIS — M25552 Pain in left hip: Principal | ICD-10-CM

## 2024-01-10 DIAGNOSIS — M542 Cervicalgia: Principal | ICD-10-CM

## 2024-01-10 MED ADMIN — methylPREDNISolone acetate (DEPO-Medrol) injection 40 mg: 40 mg | INTRAMUSCULAR | @ 17:00:00 | Stop: 2024-01-10

## 2024-01-10 NOTE — Unmapped (Signed)
 Patient here with ongoing but improved neck pain, and worsened left hip pain. Interested in trigger point injections.     Patient with muscle spasms/pain in the neck and hip after fall. Trigger point injection discussed with patient. Patient would like a trigger point injection today.    The right posterior neck area was cleaned/prepped in the usual fashion. A trigger point injection was performed at the site of maximal tenderness along the right greater occipital foramen using a G27 1 needle administered 1% plain Lidocaine 10mg /mL concentration x 1.5 mL total volume. This was well tolerated.     The left posterior neck area was cleaned/prepped in the usual fashion. A trigger point injection was performed at the site of maximal tenderness along the left greater occipital foramen using a G27 1 needle administered 1% plain Lidocaine 10mg /mL concentration x 1.5 mL total volume. This was well tolerated.     The left hip along the greater trochanteric bursa was cleaned/prepped in the usual fashion. A trigger point injection was performed at the site of maximal tenderness along the left hip along the greater trochanteric bursa using a G27 1 needle administered 1% plain Lidocaine 10mg /mL concentration x 2 mL with 40mg  of methylprednisolone  (40mg /mL concentration with full 40 mg / 1 mL dose given) for a total of 3 mL injection volume. This was well tolerated.     A total of three distinct trigger point injections were administered. CPT [phone]

## 2024-01-17 MED ORDER — ZEPBOUND 12.5 MG/0.5 ML SUBCUTANEOUS PEN INJECTOR
SUBCUTANEOUS | 0 refills | 0.00000 days | Status: CP
Start: 2024-01-17 — End: 2024-02-08
  Filled 2024-02-05: qty 2, 28d supply, fill #1

## 2024-01-28 MED ADMIN — tuberculin PPD injection: 5 [IU] | INTRADERMAL | @ 13:00:00 | Stop: 2024-01-28

## 2024-01-28 NOTE — Unmapped (Signed)
 PPD placed in the left forearm. Patient tolerated well.     Patient instructed to return in 48-72 hours. Patient verbalized understanding.

## 2024-01-29 ENCOUNTER — Encounter
Admit: 2024-01-29 | Discharge: 2024-01-30 | Payer: BLUE CROSS/BLUE SHIELD | Attending: Registered" | Primary: Registered"

## 2024-01-29 NOTE — Unmapped (Signed)
 [facility] Outpatient Nutrition Services   Medical Nutrition Therapy Consultation       Visit Type:    Initial Assessment    Referral Reason:        [name] is a 48 y.o. female seen for medical nutrition therapy for weight loss/weight maintenance.     Her active problem list, medication list, allergies, family history, social history, health maintenance, and lab results were reviewed.     Her interim medical history is significant for Vitamin D  deficiency, Anemia, Asthma, Migraines and Obesity.    Bariatric Surgery History:  Surgery Date: 04/13/2015  Surgical Procedure: s/p laparoscopic sleeve gastrectomy  Surgeon: Dr. Librada  Pre Surgery Weight: BMI of 56 per chart     Anthropometrics   Height: 157.3 cm (5' 1.93)   Weight:75.3 kg (166 lb)  Body mass index is 30.43 kg/m??.    Wt Readings from Last 5 Encounters:   01/29/24 75.3 kg (166 lb)   01/09/24 77.1 kg (170 lb)   11/08/23 83.4 kg (183 lb 12.8 oz)   09/27/23 86.2 kg (190 lb)   07/22/23 87.5 kg (192 lb 12.8 oz)     Adjusted Body Weight : 60.1 kg  Ideal Body Weight: 49.81 kg  Weight in (lb) to have BMI = 25: 136.1      Nutrition Risk Screening:   Food Insecurity: No Food Insecurity (01/07/2024)    Hunger Vital Sign     Worried About Running Out of Food in the Last Year: Never true     Ran Out of Food in the Last Year: Never true        Nutrition Focused Physical Exam:    Nutrition Evaluation  Overall Impressions: Unable to perform Nutrition-Focused Physical Exam at this time due to visit type- virtual (01/29/24 1308)  Nutrition Designation: Obese class I  (BMI 30.00 - 34.99 kg/m2) (01/29/24 1308)     Malnutrition Screening:   Patient does not meet AND/ASPEN criteria for malnutrition at this time (01/29/24 1309)    Biochemical Data, Medical Tests and Procedures:  Lab Results   Component Value Date    HGB 11.8 11/08/2023    HCT 35.7 11/08/2023    MCV 86.6 11/08/2023    IRON 69 10/11/2023    TIBC 299 10/11/2023    FERRITIN 37.8 05/02/2022 VITAMINB12 256 10/11/2023    FOLATE 6.1 10/11/2023    VITDTOTAL 15.8 (L) 10/11/2023     Lab Results   Component Value Date    VITAMIN B 6 4 (L) 10/11/2023    ALT <7 (L) 07/22/2023    AST 256 10/11/2023     Lab Results   Component Value Date    CHOL 161 02/06/2023    HDL 54 02/06/2023    LDL 87 02/06/2023    NONHDL 107 02/06/2023    TRIG 98 02/06/2023    A1C 4.7 (L) 06/10/2023    TSH 0.913 11/08/2023     Medications and Vitamin/Mineral Supplementation:   All nutritionally pertinent medications/supplements reviewed on 01/29/2024  She is taking Bariatric vitamins     Current Medications[1]    Nutrition History:   Dietary Restrictions: She reported food allergies to mustard.    Gastrointestinal Issues: Constipation  Food Aversions: meat- can eat seafood - started 6 months ago   Gastrointestinal Issues: none reported   Dumping Syndrome: not reported   Symptoms of hypoglycemia: denies  Hunger and Satiety: Denied issues.   Food Safety and Access: She did not report issues.  Diet Recall:   Time Intake   11:30 am-12:30 pm    Soft taco or chicken sandwich    8 pm  Baked chicken, beans     Food-Related History:  Beverages:  coffee, tea, ginger ale - occasionally   Usual Food Choices: granola, carrots        Eating Behaviors:  Overeating: Denied issues with overeating.   Emotional Eating: No issues noted.   Grazing: Denied issues.   Fast Eating: Denied issues.   Nighttime Eating: None.    Physical Activity:  Physical activity level is light with some exercise.  15 K steps on average   Type: walking    Duration: 30-60 minutes    Frequency: twice per week    Daily Estimated Nutritional Needs:  Energy: 1322 kcals [Per [place]-Jeor Equation using last recorded weight, 75.3 kg (01/30/24 1338)]  Protein: 60-72 gm [1.0-1.2 gm/kg using adjusted body weight, 60.1 kg (01/30/24 1338)]  Carbohydrate:   [45-60% of kcal]  Fluid: 2000 [ ]   Fat: total fat: 20-35% of kcal, saturated fat: < 7% of kcal , trans fat: as low as possible  Fiber: 25-30 g  Sodium:  <1500 mg   Cholesterol: <200 mg  Added Sugars: <10% of kcal     Nutrition Goals & Evaluation      Maintain weight loss or continued weight loss to 136 lb for a BMI of 25 (New)  Apply dietary and lifestyle habits to avoid weight regain  (New)  Meet daily nutritional needs (New)    Nutrition goals reviewed, relevant barriers identified and addressed: none evident. Jaice is evaluated to have a willingness and ability to achieve nutrition goals.       Nutrition Assessment       Frances Hernandez is s/p sleeve gastrectomy  on 04/13/2015. She had lost weight after surgery but gradually regained the weight. She has been taking mediations for weight loss and is currently on Zepbound . She is comfortable at her current weight but is open to additional weight loss.  Recent diet history suggest she is not meeting estimated nutrition needs. She is not following post-op bariatric diet guidelines and is not meeting near goal protein intake of 60-80 grams/day. She was taking a generic multivitamin but recently started taking a bariatric vitamin per recommendation of MD. Physical activity level is light with some exercise.    Nutrition Intervention      Nutrition Plan: healthy diet with post bariatric considerations  Increase variety of nutrient dense foods - focus on non-starchy vegetables and pair with a source of protein   Meet daily estimated nutrition needs  Hydrate daily with water.  Limit intake of ultra processed foods and snacks like chips and sweets  Track  intake at least 3 days per week.     Education: Healthy diet/eating style.   Reviewed healthy options for meals and snacks.   Discussed meal pattern and encouraged intake consistency.   Worked with patient to develop a  meal schedule.   Reviewed macronutrient balance in diet and provided examples.     Education resource(s)  Handouts promoting nutrition intervention    Lists of foods recommended and foods not recommended   Sample menu plan(s)   Recipes supporting nutrition intervention    Online resources    After visit summary with patient instructions      Patient verbally acknowledge understanding nutrition plan as presented.     Follow up will occur in 4-12 weeks.     Food/Nutrition-related  history, Anthropometric measurements, Biochemical data, medical tests, procedures, Nutrition-focused physical findings, Patient understanding or compliance with intervention and recommendations , Effectiveness of nutrition interventions, and Effectiveness of nutrition prescription/order   will be assessed at time of follow-up.     Recommendations for Referring Provider :  None at this time         Patient referred to outpatient nutrition services as part of therapy/treatment plans.        The patient reports they are physically located in [place]  and is currently: at home. I conducted a audio/video visit. I spent  42m 10s on the video call with the patient. I spent an additional 20 minutes on pre- and post-visit activities on the date of service .         I am located on-site and the patient is located off-site for this visit.                        [1]   Current Outpatient Medications   Medication Sig Dispense Refill    albuterol  HFA 90 mcg/actuation inhaler Inhale 2 puffs every four (4) hours as needed for wheezing or shortness of breath. 8.5 g 11    budesonide -formoterol  (SYMBICORT ) 80-4.5 mcg/actuation inhaler Inhale 2 puffs two (2) times a day. 10.2 g 11    busPIRone  (BUSPAR ) 5 MG tablet Take 1 tablet (5 mg total) by mouth every eight (8) hours as needed. 20 tablet 1    diclofenac  sodium (VOLTAREN ) 1 % gel Apply 2 g topically four (4) times a day. 350 g 0    ergocalciferol -1,250 mcg, 50,000 unit, (DRISDOL ) 1,250 mcg (50,000 unit) capsule Take one pill by mouth once a week for 12 weeks, then decrease to one pill every two weeks 12 capsule 1    fluticasone  propionate (FLONASE ) 50 mcg/actuation nasal spray Instill 2 sprays into each nostril daily. 16 g 11    modafinil  (PROVIGIL ) 100 MG tablet Take 1 tablet (100 mg total) by mouth daily. 30 tablet 1    montelukast  (SINGULAIR ) 10 mg tablet Take 1 tablet (10 mg total) by mouth nightly. 90 tablet 3    norethindrone  (AYGESTIN ) 5 mg tablet Take 0.5 tablets (2.5 mg total) by mouth daily. 45 tablet 3    ondansetron  (ZOFRAN ) 4 MG tablet Take 1 tablet (4 mg total) by mouth every eight (8) hours as needed for nausea. 20 tablet 0    tirzepatide  (ZEPBOUND ) 10 mg/0.5 mL injection pen Inject 10 mg under the skin every seven (7) days for 4 doses. 2 mL 3    [START ON 02/14/2024] tirzepatide , weight loss, (ZEPBOUND ) 15 mg/0.5 mL injection pen Inject 15 mg under the skin every seven (7) days. 2 mL 5    tizanidine  (ZANAFLEX ) 4 MG tablet Take 1 tablet (4 mg total) by mouth every eight (8) hours as needed (for muscle spasms). 60 tablet 1    topiramate  (TOPAMAX ) 100 MG tablet Take 1 tablet (100 mg total) by mouth nightly. 90 tablet 3    triamcinolone  (KENALOG ) 0.1 % cream APPLY A THIN LAYER TO THE AFFECTED AREA UNTIL FLAT AND NO LONGER ITCHY FOR A MAX OF 2 WEEKS ON THE FACE AND 4 WEEKS ON HANDS AND FEET PER FLARE      venlafaxine  (EFFEXOR  XR) 37.5 MG 24 hr capsule Take 1 capsule (37.5 mg total) by mouth daily. 30 capsule 2    vit B complx-folic ac-C-biotin 1 mg-100 mg- 300 mcg Tab  No current facility-administered medications for this visit.

## 2024-01-30 NOTE — Unmapped (Signed)
 Nutrition Recommendations.     Try to keep a consistent meal pattern and avoid  going more than 4 hours without eating.    Spread meals out into smaller meals. Consider eating 5-6 times per day - or every 3 hours.  If necessary, you may set a timer to keep you on track.     Focus on healthy food options like lean protein and non-starchy vegetables.     Make sure to include a source of protein with all your meals, about 3-4 ounces. Choose lean protein like chicken or fish. Consider plant-based protein like tofu and lentils.    Fill half your plate with non-starchy vegetables, like cauliflower, green beans, or broccoli (use handout provided for additional suggestions)    Choose whole grains in place of refined, such as whole wheat bread, brown rice, or quinoa.     Foods to Limit or Avoid   Foods high in sugar and/or sugar alcohols  High carb foods like grits, oatmeal, potatoes, rice, pasta, cereal  chips/snack foods provide little nutrition and can cause weight regain.   Liquid calories like juice, sweet tea, coffee drinks are high in sugar and are a ???sneaky??? way to drink far too many carbs and calories.    of protein at each meal. Do not eat or drink more than 30g of protein in a single sitting.    Exercise    Try to stay active as able throughout your day. Aim for a minimum of 150 minutes of moderate/intense physical activity per week.    If you are starting an exercise routine for the first time, check with your physician to ensure that it is safe for you to do so. Start slowly to avoid possible injury or burnout.

## 2024-01-30 NOTE — Unmapped (Signed)
 Addended by: LAURI ABRAHAMS on: 01/30/2024 11:41 AM     Modules accepted: Orders

## 2024-02-12 NOTE — Unmapped (Signed)
 patient left my chart message wanting a rgyn appt for Menopause/and follow up/already has an appt 9/24 with Thalia

## 2024-02-13 NOTE — Unmapped (Signed)
 patient left my chart message wanting a rgyn appt for hot flashes with Rogelio

## 2024-02-14 MED ORDER — ZEPBOUND 15 MG/0.5 ML SUBCUTANEOUS PEN INJECTOR
SUBCUTANEOUS | 5 refills | 0.00000 days | Status: CP
Start: 2024-02-14 — End: ?

## 2024-02-14 NOTE — Unmapped (Signed)
 [employer], Designer, multimedia, attempted patient contact by [employer] for [employer] [facility] - left voicemail to return call.Next attempt in 1-2 business days, if no further patient contact    Frances Hernandez  February 14, 2024 1:16 PM

## 2024-02-26 ENCOUNTER — Encounter
Admit: 2024-02-26 | Discharge: 2024-02-26 | Payer: BLUE CROSS/BLUE SHIELD | Attending: Student in an Organized Health Care Education/Training Program | Primary: Student in an Organized Health Care Education/Training Program

## 2024-02-26 LAB — C-REACTIVE PROTEIN: C-REACTIVE PROTEIN: <5 mg/L (ref <9.9)

## 2024-02-26 LAB — SEDIMENTATION RATE: ERYTHROCYTE SEDIMENTATION RATE: 16 mm/h (ref 0–30)

## 2024-02-26 MED ORDER — TIZANIDINE 4 MG TABLET
ORAL_TABLET | Freq: Three times a day (TID) | ORAL | 1 refills | 20.00000 days | Status: CP | PRN
Start: 2024-02-26 — End: 2024-02-26

## 2024-02-26 MED ORDER — TIZANIDINE 2 MG TABLET
ORAL_TABLET | Freq: Three times a day (TID) | ORAL | 1 refills | 20.00000 days | Status: CP | PRN
Start: 2024-02-26 — End: ?

## 2024-02-26 NOTE — Unmapped (Signed)
 Assessment and Plan:     Assessment & Plan  Chronic multisite joint pain  Chronic joint pain in neck, hips, and knees with recent exacerbation. Possible overuse or autoimmune etiology considered due to family history of lupus and rheumatoid arthritis.  - Perform occipital nerve block for neck pain.  - Order blood tests for RA  - Prescribe tizanidine  2 mg for muscle relaxation, advise lower daytime dose to avoid sedation.  - Advise regular activity to maintain joint function.  - Educate on pacing activities to prevent overexertion.    Procedure note; Interested in trigger point injections.      Patient with muscle spasms/pain in the neck and hip after fall. Trigger point injection discussed with patient. Patient would like a trigger point injection today.     The right posterior neck area was cleaned/prepped in the usual fashion. A trigger point injection was performed at the site of maximal tenderness along the right greater occipital foramen using a G27 1 needle administered 1% plain Lidocaine 10mg /mL concentration x 1.5 mL total volume. This was well tolerated.      The left posterior neck area was cleaned/prepped in the usual fashion. A trigger point injection was performed at the site of maximal tenderness along the left greater occipital foramen using a G27 1 needle administered 1% plain Lidocaine 10mg /mL concentration x 1.5 mL total volume. This was well tolerated.     Anxiety  Anxiety worsened by stress and overexertion.  - Advise on stress management techniques and pacing activities.     Return if symptoms worsen or fail to improve.    Subjective:     HPI: Frances Hernandez is a 48 y.o. female here for Pain (Patient is having neck pain that is radiating down her back and hips) and Anxiety    History of Present Illness  Frances Hernandez is a 48 year old female who presents with widespread joint pain and neck pain.    She has been experiencing widespread joint pain and neck pain for the past week, which began while walking at work. The pain affects her neck, both hips, and knees, is exacerbated by movement, and worsens when lying down. She has a history of neck pain for which she previously received trigger point injections, which were helpful.    Additional symptoms include pain upon taking deep breaths, particularly on one side, and eye twitching. She has been using over-the-counter hydrating eye drops and Flonase  for potential dryness and allergies. No recent changes in medication use are reported.    She experiences morning stiffness lasting about ten minutes, requiring her to sit on the side of the bed and move around to loosen up. She has been using Tizanidine  since Monday, which she feels has helped with the neck pain.    Family history is significant for lupus in a cousin and rheumatoid arthritis in her mother. She has not had any prior lab work for autoimmune arthritis but recalls having an ANA test last year to investigate why she stays cold.    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    Having ongoing neck pain  Came on suddenly  No other systemic symptoms    When laying down and still, she hurts more  Both hips, knees, neck      All review of symptoms negative unless noted in HPI.    Objective:     Vitals:    02/26/24 0925   BP: 146/91   BP Site: L Arm   BP Position: Sitting   BP Cuff Size: Medium   Pulse: 84   Temp: 37.2 ??C (98.9 ??F)   TempSrc: Temporal   SpO2: 100%     There is no height or weight on file to calculate BMI.  Physical Exam:  General: Well developed. Well nourished. In no acute distress.  HEENT:  Normocephalic.  Atraumatic.   Neck: focal TTP along posterior base of skull at GOF   Heart:  Regular rate and rhythm  Lungs:  No respiratory distress.  Lungs clear to auscultation bilaterally.  Skin:  Warm, dry, no rashes  Neuro:  Non-focal.      Current Medications:     Current Medications[1]      Con JAYSON Dawn, MD        Note - This record has been created using Dragon software/Abridge AI software. Chart creation errors have been sought, but may not always have been located. Such creation errors do not reflect on the standard of medical care.         [1]   Current Outpatient Medications   Medication Sig Dispense Refill    albuterol  HFA 90 mcg/actuation inhaler Inhale 2 puffs every four (4) hours as needed for wheezing or shortness of breath. 8.5 g 11    budesonide -formoterol  (SYMBICORT ) 80-4.5 mcg/actuation inhaler Inhale 2 puffs two (2) times a day. 10.2 g 11    busPIRone  (BUSPAR ) 5 MG tablet Take 1 tablet (5 mg total) by mouth every eight (8) hours as needed. 20 tablet 1    diclofenac  sodium (VOLTAREN ) 1 % gel Apply 2 g topically four (4) times a day. 350 g 0    ergocalciferol -1,250 mcg, 50,000 unit, (DRISDOL ) 1,250 mcg (50,000 unit) capsule Take one pill by mouth once a week for 12 weeks, then decrease to one pill every two weeks 12 capsule 1    fluticasone  propionate (FLONASE ) 50 mcg/actuation nasal spray Instill 2 sprays into each nostril daily. 16 g 11    modafinil  (PROVIGIL ) 100 MG tablet Take 1 tablet (100 mg total) by mouth daily. 30 tablet 1    montelukast  (SINGULAIR ) 10 mg tablet Take 1 tablet (10 mg total) by mouth nightly. 90 tablet 3    norethindrone  (AYGESTIN ) 5 mg tablet Take 0.5 tablets (2.5 mg total) by mouth daily. 45 tablet 3    ondansetron  (ZOFRAN ) 4 MG tablet Take 1 tablet (4 mg total) by mouth every eight (8) hours as needed for nausea. 20 tablet 0    tirzepatide  (ZEPBOUND ) 10 mg/0.5 mL injection pen Inject 10 mg under the skin every seven (7) days for 4 doses. 2 mL 3    tirzepatide , weight loss, (ZEPBOUND ) 15 mg/0.5 mL injection pen Inject 15 mg under the skin every seven (7) days. 2 mL 5    tizanidine  (ZANAFLEX ) 2 MG tablet Take 1 tablet (2 mg total) by mouth every eight (8) hours as needed (for muscle spasms). 60 tablet 1    topiramate  (TOPAMAX ) 100 MG tablet Take 1 tablet (100 mg total) by mouth nightly. 90 tablet 3    triamcinolone  (KENALOG ) 0.1 % cream APPLY A THIN LAYER TO THE AFFECTED AREA UNTIL FLAT AND NO LONGER ITCHY FOR A MAX OF 2 WEEKS ON THE FACE AND 4  WEEKS ON HANDS AND FEET PER FLARE      venlafaxine  (EFFEXOR  XR) 37.5 MG 24 hr capsule Take 1 capsule (37.5 mg total) by mouth daily. 30 capsule 2    vit B complx-folic ac-C-biotin 1 mg-100 mg- 300 mcg Tab        No current facility-administered medications for this visit.

## 2024-02-27 LAB — RHEUMATOID FACTOR, QUANT: RHEUMATOID FACTOR: 4.9 [IU]/mL (ref <14.0)

## 2024-02-28 LAB — CYCLIC CITRUL PEPTIDE ANTIBODY, IGG
CCP ANTIBODIES: 21 {ELISA'U} — ABNORMAL HIGH (ref <7.0)
CCP IGG ANTIBODIES: POSITIVE — AB

## 2024-03-20 NOTE — Unmapped (Signed)
 Patient is here for a nurse visit. Patient received Flu Vaccine. See immunization tab for details.    Patient had no questions or concerns at this time
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
## 2024-03-22 NOTE — Unmapped (Addendum)
 Frances Hernandez is a 48 y.o. female who presents with the following Obesity Related complications (Elevated blood pressures, intermittent) for medical weight loss follow up. 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    Body mass index is 28.01 kg/m??. Current AOB Medications:  Topiramate  100 mg daily  (migraines/wt management), zepbound  10 mg weekly.    Plan:  Zepbound : Decrease to 7.5 mg weekly.  Pt set weight goal of ~ 155-160 lbs.    Treatment of interest: lifestyle and pharmacologic options.  *Caution with the following medications: Related to history of hypertension, Caution: Phentermine  (Adipex), Qsymia, Bupropion (Wellbutrin), Naltrexone, Contrave.     Treatment Course:      Date: Weight   (lbs) BMI   (kg/m2) Treatment  Change   (lbs)    Change   %  Change from   Initial Visit   (lbs) % Change  from   Initial Visit Comments:     Initial   Visit   232 lbs Initial   Visit  Metformin    X x X X    12/10/19 233 lb 39.97 Metformin  1000  Start wegovy         03/17/20 209 lb  35.96 Metformin  1000  Ozempic  1  Topamax  50        06/16/20 194 lb  35.62 Metformin  1000  Wegovy  2.4  Topamax  50        11/03/20 176 lb  32.22 Metformin  1000  Wegovy  2.4  Topamax  50        05/11/21 180 lb 31.89  Metformin  1000  Wegovy  2.4  Topamax  50        10/24/21  189 lb 34.57 Metformin  1000  Wegovy  2.4  Topamax  50        05/02/22  176 lb 31.18  Metformin  1000  Wegovy  2.4  Topamax  25        08/29/22 208 lb  35.82  Metformin  1000  Wegovy  2.4  Topamax  100  Phentermine  37.5        11/21/22 telehealth  Metformin  1000  Restart wegovy .  Topamax  100  Phentermine  37.5        03/11/23 199 lb 36.99  Metformin  1000  Wegovy  2.4.  Topamax  100  Phentermine  37.5        07/22/23 192 lb  35.26 metformin  XR 1000 mg daily   - topiramate  100 mg daily   - Wegovy  2.4 mg once a week (1st dose Saturday, 03/09/23)  - phentermine  37.5 mg daily => not taking         09/27/23 190 lbs 34.75 metformin  XR 1000 mg daily   - topiramate  100 mg daily   - Wegovy  2.4 mg once a week (1st dose Saturday, 03/09/23)->dc.  - phentermine  37.5 mg daily->dc  Start zepbound  7.5 mg. -2 lbs -1.04% -42 lbs -18.10%    01/09/24 170 lbs 31.16 Topamax  100 mg (migraines).  Zepbound  15 mg weekly->10 mg. -20 lbs -10.53% -62 lbs -26.72%    03/23/24 163 lbs 28.01 Topamax  100 mg (migraines/wt)  Zepbound  10 mg->7.5 mg -7 lbs -4.12% -69 lbs -29.74%       Bariatric Surgery:   S/p sleeve gastrectomy with Dr. Librada in 2016. The  BMI prior to surgery was 56 and a nadir BMI after surgery was 32.    Pt is taking a Bariatric vitamin-Procare.    Diet Recommendations: Encouraged protein, fruits and veggies with meals. Pt to consider a complex carb at dinner.  Pt is followed by nutritionist Eleanor Necessary.  Pt could consider a future referral to family medicine appetite awareness counseling.  Nutrition counseling and education offered included:  general nutrition guidance and education.         Physical Activity / Exercise: Pt is very active, getting 13000-15000 steps/day + and she recently added yoga on Wednesday. 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 Pt uses resistance bands when she is unable to make it to yoga.  Pt has been given a link to [place] exercises.  Discussed importance of physical activity in weight maintenance.   Exercise counseling and education offered included: physical activity assessment    Sleep/Stress management: Pt listens to the calm app.   Encouraged to aim for 8 hours of sleep per night. Aim to go to sleep nightly at the same time, limit screen time 1 hour before bed.        Labs ordered: Rechecking previously low vitamin levels (vitamin d , b 6, b 12; note pt started bariatric vitamin ~ 1 mo ago).    AOMs are contraindicated in pregnancy.  Contraception:  tubal ligation

## 2024-03-23 ENCOUNTER — Ambulatory Visit: Admit: 2024-03-23 | Discharge: 2024-03-24 | Payer: BLUE CROSS/BLUE SHIELD | Attending: Family | Primary: Family

## 2024-03-23 DIAGNOSIS — Z6828 Body mass index (BMI) 28.0-28.9, adult: Principal | ICD-10-CM

## 2024-03-23 DIAGNOSIS — N898 Other specified noninflammatory disorders of vagina: Principal | ICD-10-CM

## 2024-03-23 DIAGNOSIS — N951 Menopausal and female climacteric states: Principal | ICD-10-CM

## 2024-03-23 DIAGNOSIS — Z8679 Personal history of other diseases of the circulatory system: Principal | ICD-10-CM

## 2024-03-23 DIAGNOSIS — E663 Overweight: Principal | ICD-10-CM

## 2024-03-23 DIAGNOSIS — Z903 Acquired absence of stomach [part of]: Principal | ICD-10-CM

## 2024-03-23 LAB — VITAMIN D 25 HYDROXY: VITAMIN D, TOTAL (25OH): 29.5 ng/mL (ref 20.0–80.0)

## 2024-03-23 LAB — VITAMIN B12: VITAMIN B-12: 324 pg/mL (ref 211–911)

## 2024-03-23 MED ORDER — ESTRADIOL 0.05 MG-NORETHINDRONE 0.14 MG/24 HR SEMIWKLY TRANSDERM PATCH
MEDICATED_PATCH | TRANSDERMAL | 12 refills | 28.00000 days | Status: CP
Start: 2024-03-23 — End: 2025-03-23
  Filled 2024-03-25: qty 8, 28d supply, fill #0

## 2024-03-23 MED ORDER — ZEPBOUND 7.5 MG/0.5 ML SUBCUTANEOUS PEN INJECTOR
SUBCUTANEOUS | 6 refills | 28.00000 days | Status: CP
Start: 2024-03-23 — End: 2024-04-20
  Filled 2024-03-25: qty 2, 28d supply, fill #0

## 2024-03-23 NOTE — Unmapped (Signed)
 It was great seeing you in clinic today! Please see below for an overview of what we discussed:     Plan: Decrease zepbound  to 7.5 mg weekly.    Goal:  Health improvement  Things to think about to help me reach my goal:                   What are you going to do?    How and how much?    How frequent?    Barriers to success?    Solutions to barriers?      Please alert the clinic if you are having rapid weight loss, such as > 2 pounds/week.    Nutrition: Work on a goal of 15-30g of protein per meal. Many patients find an app to be a helpful tool to accomplish this (MyFitnessPal, LoseIt, Cronometer, etc).  Our clinic nutritionist, Eleanor Necessary, is available for appointments. We also have an appetite awareness counselor that is available for appointments.  Additional tips: Add more fruits and vegetables to meals    Physical activity: Try to be physically active most days of the week. Consider weight resistance activities twice weekly.  Explore the following website for instructional exercise resources: https://uncwellness.com/programs/videos/    Sleep: Work on a sleep goal of 7-8 hours per night. We can provide you information on sleep hygiene.     Referrals: You will be contacted to schedule appointments for any referrals placed.     Refills: Please call your pharmacy a week before you need your next dose of medicine.    The medications used in our clinic are not considered safe in pregnancy. Please address any concerns with your primary care provider.    Urgent Concerns: You should be seen for worsening of your acute or chronic medical concerns. For Life Threatening Emergencies-- call 911 and go to the Emergency Department.     For after hours and weekend concerns:   [facility] for after-hours nurse advice at [phone]    [facility] Urgent Care Locations:   [email or web address]    Lab results:    Labs: If you have labs drawn, you will be notified through the MyChart Portal when they have been reviewed.     [email or web address]    Re-check any abnormalities with your primary care provider.    Nutrition:       Copyright ?? 2011, [employer]. For more information about The Healthy Eating Plate, please see The Nutrition Source, Department of Nutrition, [employer], www.thenutritionsource.org, and [employer], www.health.UKRank.hu. (Source: FuelStrike.hu)     Follow up:   Plan on follow up at least every 3 months. Please make sure you are up to date with your primary care provider.    Recommendations from AI:

## 2024-03-23 NOTE — Unmapped (Signed)
 Norethindrone  2.5 mg.  Menopausal sxs.    Assessment & Plan            Frances Hernandez is a 48 y.o. female [phone] who presents for annual exam.     > Mammography: biannually    > Patient Education: folate supplementation for NTD prophylaxis, domestic violence / safety at home, safe sex and condom use, STD prevention, HIV risk factors and prevention, and pap smear screening/results    Return prn or in 1 yr       Subjective:      Frances Hernandez is a 48 y.o. female [phone] who presents for annual exam. No LMP recorded. (Menstrual status: Other). Periods are rare--takes a high dose oral progestin. No abnormal discharge. The patient is not incontinent of urine.  MMG in 5/25 neg.  Pap in 8/24 showed ASCUS/HR-HPV neg.   Currently SA--new partner.  C/O a strong odor w/urination.  No dysuria.  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            Health Maintenance  > Contraceptive methods: condoms and tubal ligation.  > History of abnormal mammogram: no  > Family history of uterine or ovarian cancer: yes--mother  > Family history of breast cancer: yes-mother  > Domestic Violence History: no  > Dietary Supplements: Folate: no;  Calcium: no; Vitamin D : no  > Seat Belt Use: yes    The following portions of the patient's history were reviewed and updated as appropriate: allergies, current medications, past family history, past medical history, past social history, past surgical history, and problem list.      Review Of Systems   Constitutional: negative for fatigue and weight loss  Respiratory: negative for cough and wheezing  Cardiovascular: negative for chest pain, fatigue and palpitations  Gastrointestinal: negative for abdominal pain and change in bowel habits  Genitourinary:see above  Integument/breast: negative for nipple discharge  Musculoskeletal:negative for myalgias  Neurological: negative for gait problems and tremors  Behavioral/Psych: negative for abusive relationship, depression  Endocrine: negative for temperature intolerance          Objective:      There were no vitals taken for this visit.   The sensitive parts of the examination were performed with a chaperone.  Constitutional: Well-developed, well-nourished female in no acute distress  Neurological: Alert and oriented to person, place, and time  Psychiatric: Mood and affect appropriate  Skin: No rashes or lesions  Neck: Supple without masses. Trachea is midline.Thyroid is normal size without masses  Lymphatics: No cervical, axillary, supraclavicular, or inguinal adenopathy noted  Respiratory: Clear to auscultation bilaterally. Good air movement with normal work of breathing.  Cardiovascular: Regular rate and rhythm. Extremities grossly normal, nontender with no edema; pulses regular  Gastrointestinal: Soft, nontender, nondistended. No masses or hernias appreciated. No hepatosplenomegaly. No fluid wave. No rebound or guarding.  Breast Exam: No tenderness, masses, or nipple abnormality  Genitourinary:          External Genitalia: Normal female genitalia     Urethral Meatus: Normal caliber and position     Urethra: Midline, no masses     Bladder: Well-suspended, mildly tender     Vagina: Well-rugated, no lesions. Thin, yellow discharge     Cervix: No lesions, normal size and consistency; no cervical motion tenderness; friable      Uterus: Normal size and contour; smooth, mobile, NT  Adnexa/Parametria: No masses; no parametrial nodularity; no tenderness  Perineum/Anus: No lesions

## 2024-03-23 NOTE — Unmapped (Signed)
 Body composition scale used today, measurements obtained. Print out scanned into media tab.

## 2024-03-23 NOTE — Unmapped (Signed)
 Assessment/Plan:             Problem List Items Addressed This Visit          Genitourinary    Vaginal dryness  She endorses dryness during sex  - Recommend petroleum jelly for vulvar dryness (moisturization instructions in the after visit summary)       Other    Perimenopausal - Primary  She describes persistent pervasive menopausal symptoms (hot flashes and night sweats) for the past 2-3 months.  - Trial combination patch for symptomatic relief; discontinue Aygestin   - Follow up on symptoms and patch in 3 months           A student assisted me with documenting this service. I saw the patient and reviewed and verified all information documented by the student.  I have also documented a note including the HPI, physical exam and assessment/plan.       I personally spent 25 minutes face-to-face and non-face-to-face in the care of this patient, which includes all pre, intra, and post visit time on the date of service.  All documented time was specific to the E/M visit and does not include any procedures that may have been performed.             Patient ID: Frances Hernandez, female   DOB: November 17, 1975, 48 y.o.   MRN: [id number]    No chief complaint on file.      HPI  Frances Hernandez is a 48 y.o. female.  History of Present Illness             For the past 2-3 months, she's had hot flashes and night sweats. She's had the temp at 70F in the house and she still feels very hot. She's experienced anterior pelvic pain and mild cramping as well. Has not had periods since on norethindrone . Sister and mother went through menopause in mid 29s. She's noticed some normal white discharge with a clear discharge for the past 2-3 years. She endorses vaginal dryness especially with sex. No foul smell, pruritus, or dysuria.    She denies unintentional weight loss, headaches with aura, or smoking.      Review of Systems  Review of Systems  Constitutional: negative for fatigue and weight loss  Respiratory: negative for cough and wheezing  Cardiovascular: negative for chest pain, fatigue and palpitations  Gastrointestinal: negative for abdominal pain and change in bowel habits  Genitourinary: see above  Integument/breast: negative for nipple discharge  Musculoskeletal:negative for myalgias  Neurological: negative for gait problems and tremors  Behavioral/Psych: negative for abusive relationship, depression  Endocrine: negative for temperature intolerance        The following portions of the patient's history were reviewed and updated as appropriate: allergies, current medications, past family history, past medical history, past social history, past surgical history, and problem list.    Blood pressure 146/101, pulse 74, weight 73.9 kg (163 lb), not currently breastfeeding.    Physical Exam  Physical Exam    General:   alert   Skin:   no rash or abnormalities   Lungs:   Deferred   Heart:   Deferred   Breasts:   Deferred   Abdomen:  Deferred   Pelvis:  Deferred      Note Initiated by Frances Hernandez, MS3  March 23, 2024 10:02 AM

## 2024-03-26 LAB — VITAMIN B6: VITAMIN B6: 8 ug/L

## 2024-03-27 DIAGNOSIS — M5412 Radiculopathy, cervical region: Principal | ICD-10-CM

## 2024-03-27 DIAGNOSIS — M542 Cervicalgia: Principal | ICD-10-CM

## 2024-04-04 ENCOUNTER — Inpatient Hospital Stay: Admit: 2024-04-04 | Discharge: 2024-04-04 | Payer: BLUE CROSS/BLUE SHIELD

## 2024-04-06 DIAGNOSIS — M4802 Spinal stenosis, cervical region: Principal | ICD-10-CM

## 2024-04-06 DIAGNOSIS — M5412 Radiculopathy, cervical region: Principal | ICD-10-CM

## 2024-04-08 NOTE — Telephone Encounter (Signed)
 It has been over a year since she was seen by Dr. Marelyn. She is more than welcome to see another provider.     Thank you,   Dorthea

## 2024-04-18 DIAGNOSIS — J329 Chronic sinusitis, unspecified: Principal | ICD-10-CM

## 2024-04-18 DIAGNOSIS — J45909 Unspecified asthma, uncomplicated: Principal | ICD-10-CM

## 2024-04-18 MED ORDER — AMOXICILLIN 875 MG TABLET
ORAL_TABLET | Freq: Two times a day (BID) | ORAL | 0 refills | 7.00000 days | Status: CP
Start: 2024-04-18 — End: ?

## 2024-04-18 NOTE — Progress Notes (Signed)
 [facility] ENT Encounter  This medical encounter was conducted virtually using [email or web address]  TeleHealth protocols.    Patient ID: Frances Hernandez is a 48 y.o. female who presents by video interaction for evaluation.    I have identified myself to the patient and conveyed my credentials to Frances Hernandez.   Patient has signed informed consent on file in medical record.    Present on Video Call: Is there someone else in the room? No..    Assessment/Plan:    Diagnoses and all orders for this visit:    Sinusitis, unspecified chronicity, unspecified location  -     amoxicillin  (AMOXIL ) 875 MG tablet; Take 1 tablet (875 mg total) by mouth every twelve (12) hours.    Asthma, unspecified asthma severity, unspecified whether complicated, unspecified whether persistent (HHS-HCC)     Rec continue zyrtec and flonase  and start mucinex or sudafed with abx.      -- Discussed the new prescription noted above, including potential side effects, drug interactions, instructions for taking the medication, and the consequences of not taking it.  -- Patient verbalized an understanding of today's assessment and recommendations, as well as the purpose of ongoing medications.    Follow-up as Needed     Medication adherence and barriers to the treatment plan have been addressed. Opportunities to optimize healthy behaviors have been discussed. Patient / caregiver voiced understanding.        Subjective:     HPI  Frances Hernandez is 48 y.o. and presents today in the [facility] with ENT symptoms.  The PCP for this patient is Frances Con Gondola, MD. Pt with c/o sinus congestion with ongoing symptoms after initial improvement.  Sx 9 days.  No sob or cp.  Using inhaler for asthma as needed with coughing fits.  NO fever.  Doesn't typically need steroids when sick like this. Using zyrtec and flonase .        ROS  Review of Systems     All other ROS per HPI.    I have reviewed the problem list, past medical history, past family history, medications, and allergies and have updated/reconciled them if needed.          Objective:   Physical Exam  As part of this Video Visit, no in-person exam was conducted.  Video interaction permitted the following observations.    General: No acute distress.   HEENT: OP clear.  Uvula midline.  No visible mass or abnormality of neck.   RESP: Relaxed respiratory effort. No conversational dyspnea.   SKIN: No rashes noted.  NEURO: Normal coordination.  No tremors observed.  PSYCH: Alert and oriented.  Speech fluent and sensible.  Calm affect.           The patient reports they are physically located in [place]  and is currently: at home. I conducted a audio/video visit. I spent  60m 19s on the video call with the patient. I spent an additional 3 minutes on pre- and post-visit activities on the date of service .

## 2024-04-19 DIAGNOSIS — M25552 Pain in left hip: Principal | ICD-10-CM

## 2024-04-19 MED ORDER — TIZANIDINE 2 MG TABLET
ORAL_TABLET | Freq: Three times a day (TID) | ORAL | 1 refills | 20.00000 days | PRN
Start: 2024-04-19 — End: ?

## 2024-04-20 MED ORDER — TIZANIDINE 2 MG TABLET
ORAL_TABLET | Freq: Three times a day (TID) | ORAL | 1 refills | 20.00000 days | Status: CP | PRN
Start: 2024-04-20 — End: ?

## 2024-04-20 NOTE — Telephone Encounter (Signed)
 Called patient and left message advising Dr. Dasie does not have any availability today and his first available is next week. Advised to give callback to schedule or check if another provider has availability this week.

## 2024-04-20 NOTE — Telephone Encounter (Signed)
 Patient is requesting the following refill  Requested Prescriptions     Pending Prescriptions Disp Refills    tizanidine  (ZANAFLEX ) 2 MG tablet 60 tablet 1     Sig: Take 1 tablet (2 mg total) by mouth every eight (8) hours as needed (for muscle spasms).       Recent Visits  Date Type Provider Dept   02/26/24 Office Visit Dasie Con Gondola, MD [facility] Family Medicine [address] At [facility]   01/08/24 Office Visit Dasie, Con Gondola, MD [facility] [address] At [facility]   11/08/23 Office Visit Straub, Michal SAUNDERS, MD [facility]   08/12/23 Telemedicine Straub, Michal SAUNDERS, MD [facility] Family [facility] [place]   07/02/23 Office Visit Dasie Con Gondola, MD [facility] Medicine [address] At [facility]   06/20/23 Office Visit Crites, Nidia Ruts, MD [facility] [address] At [facility]   06/12/23 Office Visit Straub, Michal SAUNDERS, MD [facility]   Showing recent visits within past 365 days and meeting all other requirements  Future Appointments  Date Type Provider Dept   06/12/24 Appointment Dasie, Con Gondola, MD [facility]   Showing future appointments within next 365 days and meeting all other requirements       Labs: Vitals:   BP Readings from Last 3 Encounters:   03/23/24 146/101   03/23/24 111/82   02/26/24 146/91    and   Pulse Readings from Last 3 Encounters:   03/23/24 74   03/23/24 81   02/26/24 84

## 2024-04-21 DIAGNOSIS — Z6831 Body mass index (BMI) 31.0-31.9, adult: Principal | ICD-10-CM

## 2024-04-21 DIAGNOSIS — Z8639 Personal history of other endocrine, nutritional and metabolic disease: Principal | ICD-10-CM

## 2024-04-21 DIAGNOSIS — Z6827 Body mass index (BMI) 27.0-27.9, adult: Principal | ICD-10-CM

## 2024-04-21 MED ORDER — ZEPBOUND 10 MG/0.5 ML SUBCUTANEOUS PEN INJECTOR
SUBCUTANEOUS | 3 refills | 28.00000 days | Status: CP
Start: 2024-04-21 — End: 2024-05-13
  Filled 2024-04-28: qty 2, 28d supply, fill #0

## 2024-04-21 NOTE — Telephone Encounter (Signed)
 Patient scheduled for 11:40 with Dr. Con Dawn on 11/19.

## 2024-04-22 DIAGNOSIS — Z8639 Personal history of other endocrine, nutritional and metabolic disease: Principal | ICD-10-CM

## 2024-04-22 DIAGNOSIS — Z6827 Body mass index (BMI) 27.0-27.9, adult: Principal | ICD-10-CM

## 2024-04-23 DIAGNOSIS — R058 Post-viral cough syndrome: Principal | ICD-10-CM

## 2024-04-23 DIAGNOSIS — J4521 Mild intermittent asthma with (acute) exacerbation: Principal | ICD-10-CM

## 2024-04-23 DIAGNOSIS — R051 Acute cough: Principal | ICD-10-CM

## 2024-04-23 MED ORDER — PREDNISONE 20 MG TABLET
ORAL_TABLET | Freq: Every day | ORAL | 0 refills | 5.00000 days | Status: CP
Start: 2024-04-23 — End: ?

## 2024-04-23 NOTE — Progress Notes (Signed)
 Assessment and Plan:     Assessment & Plan  Asthma with acute exacerbation and post-viral cough - chronic condition with excacerbation  Acute asthma exacerbation with post-viral cough. No bacterial infection present, no indication for antibiotics. Cough due to bronchospasms. Prednisone  indicated.  - Discontinued amoxicillin .  - Prescribed prednisone  40 mg daily for 5 days.  - Continue Flonase  nasal spray daily, especially before bed and in the morning if congested.  - Use neti pot or NoMed sinus rinse with distilled water before Flonase .  - Continue humidifier and supportive measures.  - Monitor for bacterial infection signs such as fever, chills, or purulent sputum and report if present.     Return if symptoms worsen or fail to improve.     Subjective:     HPI: Frances Hernandez is a 48 y.o. female here for Follow-up (coughing)    History of Present Illness  Frances Hernandez is a 48 year old female with asthma who presents with a persistent dry cough.    She has been experiencing a persistent dry cough primarily triggered by deep breaths, which occurs mostly at night and leads to chest discomfort due to frequent coughing. The cough began following a viral illness approximately one week ago, during which she lost her voice and experienced throat pain.    She has been taking amoxicillin  since the onset of her symptoms. She continues to use Flonase  daily and Symbicort  as needed, particularly while working at home. She has not used albuterol  recently. Her current medication regimen includes a dose of 10 mg, although the specific medication is not mentioned.    No fever. The cough worsens when she talks, and she experiences bronchospasms when breathing deeply, which she attributes to her asthma. She has been sick for two weeks.    All review of symptoms negative unless noted in HPI.    Objective:     Vitals:    04/23/24 0756   BP: 130/90   BP Site: L Arm   BP Position: Sitting   BP Cuff Size: Medium   Pulse: 70   Resp: 16   Temp: 36.9 ??C (98.5 ??F)   TempSrc: Temporal   SpO2: 98%   Weight: 73.9 kg (163 lb)   Height: 158.8 cm (5' 2.5)     Body mass index is 29.34 kg/m??.  Physical Exam:  General: Well developed. Well nourished. In no acute distress.  HEENT:  Normocephalic.  Atraumatic.  Heart:  Regular rate and rhythm  Lungs:  No respiratory distress.  Lungs clear to auscultation bilaterally. Cough with deep respiration   Extremities:  No edema.   Skin:  Warm, dry, no rashes  Neuro:  Non-focal. Gait normal.   Psych:  Affect normal, answers questions appropriately, eye contact good, speech clear and coherent.      Current Medications:     Current Medications[1]      Con JAYSON Dawn, MD        Note - This record has been created using Dragon software/Abridge AI software. Chart creation errors have been sought, but may not always have been located. Such creation errors do not reflect on the standard of medical care.         [1]   Current Outpatient Medications   Medication Sig Dispense Refill    albuterol  HFA 90 mcg/actuation inhaler Inhale 2 puffs every four (4) hours as needed for wheezing or shortness of breath. 8.5 g 11    estradiol-norethindrone  (COMBIPATCH) 0.05-0.14 mg/24 hr Place  1 patch on the skin Two (2) times a week. 8 patch 12    fluticasone  propionate (FLONASE ) 50 mcg/actuation nasal spray Instill 2 sprays into each nostril daily. 16 g 11    modafinil  (PROVIGIL ) 100 MG tablet Take 1 tablet (100 mg total) by mouth daily. 30 tablet 1    montelukast  (SINGULAIR ) 10 mg tablet Take 1 tablet (10 mg total) by mouth nightly. 90 tablet 3    ondansetron  (ZOFRAN ) 4 MG tablet Take 1 tablet (4 mg total) by mouth every eight (8) hours as needed for nausea. 20 tablet 0    tirzepatide  (ZEPBOUND ) 10 mg/0.5 mL injection pen Inject 10 mg under the skin every seven (7) days for 4 doses. 2 mL 3    tizanidine  (ZANAFLEX ) 2 MG tablet Take 1 tablet (2 mg total) by mouth every eight (8) hours as needed (for muscle spasms). 60 tablet 1 topiramate  (TOPAMAX ) 100 MG tablet Take 1 tablet (100 mg total) by mouth nightly. 90 tablet 3    budesonide -formoterol  (SYMBICORT ) 80-4.5 mcg/actuation inhaler Inhale 2 puffs two (2) times a day. 10.2 g 11    diaper,brief,adult, disposable (PROCARE BARIATRIC BRIEF MISC) 1 tablet by Miscellaneous route in the morning.      predniSONE  (DELTASONE ) 20 MG tablet Take 2 tablets (40 mg total) by mouth daily. 10 tablet 0    triamcinolone  (KENALOG ) 0.1 % cream APPLY A THIN LAYER TO THE AFFECTED AREA UNTIL FLAT AND NO LONGER ITCHY FOR A MAX OF 2 WEEKS ON THE FACE AND 4 WEEKS ON HANDS AND FEET PER FLARE      venlafaxine  (EFFEXOR  XR) 37.5 MG 24 hr capsule Take 1 capsule (37.5 mg total) by mouth daily. 30 capsule 2     No current facility-administered medications for this visit.

## 2024-04-23 NOTE — Patient Instructions (Signed)
 Rinse sinuses out with [facility]urel[facility] Med sinus rinse/Netti pot before bed, then use Flonase  in each nostril. Repeat this in the morning if still congested.   Take Prednisone  40 mg at the same time each morning for 5 days.

## 2024-04-24 MED FILL — COMBIPATCH 0.05 MG-0.14 MG/24 HR TRANSDERMAL: TRANSDERMAL | 28 days supply | Qty: 8 | Fill #1

## 2024-04-27 NOTE — Telephone Encounter (Signed)
 Called and spoke with patient and advised Dr. Dasie stated he needed to have appointment with her to fill out [employer] paperwork. Informed her he stated it can be virtual. Scheduled virtual appointment on 12/5 at 11:40am per Dr. Dasie approval.

## 2024-04-27 NOTE — Progress Notes (Signed)
 PA approved for Zepbound  10mg  via fax.

## 2024-04-28 NOTE — Progress Notes (Unsigned)
 Offer her a phone or VVV to discuss further.

## 2024-05-08 DIAGNOSIS — G8929 Other chronic pain: Principal | ICD-10-CM

## 2024-05-08 DIAGNOSIS — M542 Cervicalgia: Principal | ICD-10-CM

## 2024-05-08 DIAGNOSIS — G43009 Migraine without aura, not intractable, without status migrainosus: Principal | ICD-10-CM

## 2024-05-08 DIAGNOSIS — M5441 Lumbago with sciatica, right side: Principal | ICD-10-CM

## 2024-05-08 NOTE — Progress Notes (Signed)
 The patient reports they are physically located in [place]  and is currently: at home. I conducted a audio/video visit. I spent  81m 51s on the video call with the patient. I spent an additional 10 minutes on pre- and post-visit activities on the date of service (completing paperwork).        Assessment and Plan:     Migraine without aura and without status migrainosus, not intractable    Chronic bilateral low back pain with right-sided sciatica    Neck pain         Return for Next scheduled follow up.  Subjective:     HPI: Frances Hernandez is a 48 y.o. female here for No chief complaint on file.      Telemedicine visit for FMLA paperwork  Needs intermittent leave due to pain from migraines, neck pain, back pain  Discussed this in detail today  Substantial time was spent completing form and completing documentation    She will follow up with me as scheduled in 1 month for her annual physical     All review of symptoms negative unless noted in HPI.    Objective:     PTHomeBPPhysical Exam:  General: Well developed. Well nourished. In no acute distress.  HEENT:  Normocephalic.  Atraumatic.   Lungs:  No respiratory distress.     Skin:  No rash of visualized skin.  Psych:  Affect normal, answers questions appropriately, eye contact good, speech clear and coherent.      Current Medications:     Current Medications[1]      Con JAYSON Dawn, MD      Note - This record has been created using [employer]. Chart creation errors have been sought, but may not always have been located. Such creation errors do not reflect on the standard of medical care.         [1]   Current Outpatient Medications   Medication Sig Dispense Refill    albuterol  HFA 90 mcg/actuation inhaler Inhale 2 puffs every four (4) hours as needed for wheezing or shortness of breath. 8.5 g 11    budesonide -formoterol  (SYMBICORT ) 80-4.5 mcg/actuation inhaler Inhale 2 puffs two (2) times a day. 10.2 g 11    diaper,brief,adult, disposable (PROCARE BARIATRIC BRIEF MISC) 1 tablet by Miscellaneous route in the morning.      estradiol -norethindrone  (COMBIPATCH ) 0.05-0.14 mg/24 hr Place 1 patch on the skin Two (2) times a week. 8 patch 12    fluticasone  propionate (FLONASE ) 50 mcg/actuation nasal spray Instill 2 sprays into each nostril daily. 16 g 11    modafinil  (PROVIGIL ) 100 MG tablet Take 1 tablet (100 mg total) by mouth daily. 30 tablet 1    montelukast  (SINGULAIR ) 10 mg tablet Take 1 tablet (10 mg total) by mouth nightly. 90 tablet 3    ondansetron  (ZOFRAN ) 4 MG tablet Take 1 tablet (4 mg total) by mouth every eight (8) hours as needed for nausea. 20 tablet 0    predniSONE  (DELTASONE ) 20 MG tablet Take 2 tablets (40 mg total) by mouth daily. 10 tablet 0    tirzepatide  (ZEPBOUND ) 10 mg/0.5 mL injection pen Inject 10 mg under the skin every seven (7) days for 4 doses. 2 mL 3    tizanidine  (ZANAFLEX ) 2 MG tablet Take 1 tablet (2 mg total) by mouth every eight (8) hours as needed (for muscle spasms). 60 tablet 1    topiramate  (TOPAMAX ) 100 MG tablet Take 1 tablet (100 mg total) by mouth nightly.  90 tablet 3    triamcinolone  (KENALOG ) 0.1 % cream APPLY A THIN LAYER TO THE AFFECTED AREA UNTIL FLAT AND NO LONGER ITCHY FOR A MAX OF 2 WEEKS ON THE FACE AND 4 WEEKS ON HANDS AND FEET PER FLARE      venlafaxine  (EFFEXOR  XR) 37.5 MG 24 hr capsule Take 1 capsule (37.5 mg total) by mouth daily. 30 capsule 2     No current facility-administered medications for this visit.

## 2024-05-20 ENCOUNTER — Ambulatory Visit: Admit: 2024-05-20 | Discharge: 2024-05-21 | Payer: BLUE CROSS/BLUE SHIELD

## 2024-05-20 DIAGNOSIS — M5412 Radiculopathy, cervical region: Principal | ICD-10-CM
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
## 2024-05-20 NOTE — Patient Instructions (Signed)
 Juli Credit  DPT, Cert. MDT [facility] Physical & Sports Rehabilitation  [address]  [place], [place] [address] [phone]

## 2024-05-26 MED FILL — ZEPBOUND 10 MG/0.5 ML SUBCUTANEOUS PEN INJECTOR: SUBCUTANEOUS | 28 days supply | Qty: 2 | Fill #1

## 2024-05-29 DIAGNOSIS — R030 Elevated blood-pressure reading, without diagnosis of hypertension: Principal | ICD-10-CM

## 2024-05-29 DIAGNOSIS — M5481 Occipital neuralgia: Principal | ICD-10-CM

## 2024-05-29 MED ORDER — AMLODIPINE 5 MG TABLET
ORAL_TABLET | Freq: Every day | ORAL | 1 refills | 30.00000 days | Status: CP
Start: 2024-05-29 — End: 2025-05-29
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
## 2024-06-07 NOTE — Progress Notes (Signed)
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 PROCEDURE; LONGITUDINAL GASTRECTOMY;  Surgeon: Evalene Ozell Greet, MD;  Location: MAIN OR [facility];  Service: Gastrointestinal    TRIGGER POINT INJECTION      TUBAL LIGATION  11/1997   [4]   Social History  Socioeconomic History    Marital status: Married   Tobacco Use    Smoking status: Former     Current packs/day: 0.00     Average packs/day: 0.3 packs/day for 2.0 years (0.5 ttl pk-yrs)     Types: Cigarettes     Start date: 01/02/1998     Quit date: 05/05/2001     Years since quitting: 23.1    Smokeless tobacco: Never   Vaping Use    Vaping status: Never Used   Substance and Sexual Activity    Alcohol use: Yes     Alcohol/week: 2.0 standard drinks of alcohol     Types: 2 Glasses of wine per week     Comment: Occasionally- social events    Drug use: Never    Sexual activity: Not Currently     Partners: Male     Birth control/protection: Surgical     Comment: Tubal ligation- endometrial ablation   Other Topics Concern    Exercise Yes     Comment: I use to exercise past 6 mo not able to    Living Situation No   Social History Narrative    Is from: [place], [place]    Living arrangements: lives with husband and children (all children now are over 21yo)    Level of Education: Assoc Degree    Marital status:  Married    Children: 4    Current Occupation:  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, now a CMA    Smoking History:  Previously smoked 2 pk a week x 4 years or Quit in 2002    Substance Abuse History: denies    Alcohol consumption: denies    Surveyor, Quantity Status: stable        Public librarian, has a theatre stage manager                     Social Drivers of Health     Food Insecurity: No Food Insecurity (01/07/2024)    Hunger Vital Sign     Worried About Programme Researcher, Broadcasting/film/video in the Last Year: Never true     Ran Out of Food in the Last Year: Never true   Tobacco Use: Medium Risk (05/08/2024)    Patient History     Smoking Tobacco Use: Former     Smokeless Tobacco Use: Never   Transportation Needs: No Transportation Needs (01/07/2024)    PRAPARE - Therapist, Art (Medical): No     Lack of Transportation (Non-Medical): No   Housing: Low Risk (01/07/2024)    Housing     Within the past 12 months, have you ever stayed: outside, in a car, in a tent,  in an overnight shelter, or temporarily in someone else's home (i.e. couch-surfing)?: No     Are you worried about losing your housing?: No   Utilities: High Risk (01/07/2024)    Utilities     Within the past 12 months, have you been unable to get utilities (heat, electricity) when it was really needed?: Yes   Interpersonal Safety: Not At Risk (02/26/2024)    Interpersonal Safety     Unsafe Where You Currently Live: No     Physically Hurt by Anyone: No     Abused by Anyone: No   Financial Resource Strain: Medium Risk (01/07/2024)    Overall Financial Resource Strain (CARDIA)     Difficulty of Paying Living Expenses: Somewhat hard   Internet Connectivity: Internet connectivity concern unknown (01/07/2024)    Internet Connectivity     Do you have access to internet services: Yes

## 2024-06-07 NOTE — Assessment & Plan Note (Addendum)
 Frances Hernandez is a 49 y.o. female who presents with the following Obesity Related complications (hypertension) for medical weight loss follow up. 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    Body mass index is 29.37 kg/m??. Current AOB Medications:  Topiramate  100 mg daily  (migraines/wt management), zepbound  10 mg weekly.    Plan:  Zepbound : Continue zepbound  10 mg weekly.   Pt set weight goal of ~ 160 lbs and notes she is currently at goal. Pt reports at lower weights she felt fatigued and she feels healthy at current weight.     Treatment of interest: lifestyle and pharmacologic options.  *Caution with the following medications: Related to history of hypertension, Caution: Phentermine  (Adipex), Qsymia, Bupropion (Wellbutrin), Naltrexone, Contrave.     Treatment Course:      Date: Weight   (lbs) BMI   (kg/m2) Treatment  Change   (lbs)    Change   %  Change from   Initial Visit   (lbs) % Change  from   Initial Visit Comments:     Initial   Visit   232 lbs Initial   Visit  Metformin    X x X X    12/10/19 233 lb 39.97 Metformin  1000  Start wegovy         03/17/20 209 lb  35.96 Metformin  1000  Ozempic  1  Topamax  50        06/16/20 194 lb  35.62 Metformin  1000  Wegovy  2.4  Topamax  50        11/03/20 176 lb  32.22 Metformin  1000  Wegovy  2.4  Topamax  50        05/11/21 180 lb 31.89  Metformin  1000  Wegovy  2.4  Topamax  50        10/24/21  189 lb 34.57 Metformin  1000  Wegovy  2.4  Topamax  50        05/02/22  176 lb 31.18  Metformin  1000  Wegovy  2.4  Topamax  25        08/29/22 208 lb  35.82  Metformin  1000  Wegovy  2.4  Topamax  100  Phentermine  37.5        11/21/22 telehealth  Metformin  1000  Restart wegovy .  Topamax  100  Phentermine  37.5        03/11/23 199 lb 36.99  Metformin  1000  Wegovy  2.4.  Topamax  100  Phentermine  37.5        07/22/23 192 lb  35.26 metformin  XR 1000 mg daily   - topiramate  100 mg daily   - Wegovy  2.4 mg once a week (1st dose Saturday, 03/09/23)  - phentermine  37.5 mg daily => not taking         09/27/23 190 lbs 34.75 metformin  XR 1000 mg daily   - topiramate  100 mg daily   - Wegovy  2.4 mg once a week (1st dose Saturday, 03/09/23)->dc.  - phentermine  37.5 mg daily->dc  Start zepbound  7.5 mg. -2 lbs -1.04% -42 lbs -18.10%    01/09/24 170 lbs 31.16 Topamax  100 mg (migraines).  Zepbound  15 mg weekly->10 mg. -20 lbs -10.53% -62 lbs -26.72%    03/23/24 163 lbs 28.01 Topamax  100 mg (migraines/wt)  Zepbound  10 mg->7.5 mg -7 lbs -4.12% -69  lbs -29.74%    06/08/24 163 lbs 29.37 Topamax  100 mg (migraines/wt)  Zepbound  ~ ~ -69 lbs -29.74% Ht confirmed.      Bariatric Surgery:   S/p sleeve gastrectomy with Dr. Librada in 2016. The BMI prior to surgery was 56 and a nadir BMI after surgery was 32.    Pt is taking a Bariatric vitamin-Procare.    Diet Recommendations: Encouraged protein, fruits and veggies with meals. Pt to consider a complex carb at dinner.  Pt is followed by nutritionist Eleanor Necessary.  Mediterranean and plant based protein handouts provided.  Pt is drinking plenty of water and flavored waters.  Pt could consider a future referral to family medicine appetite awareness counseling.  Nutrition counseling and education offered included:  general nutrition guidance and education.      Physical Activity / Exercise: Pt is very active, getting 13000-15000 steps/day + and she recently added yoga on Wednesday. 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 Pt uses resistance bands when she is unable to make it to yoga.  Goal: Add one additional day/week of weight resistance activities.  Pt has been given a link to [place] exercises.  Discussed importance of physical activity in weight maintenance.   Exercise counseling and education offered included: physical activity assessment    Sleep/Stress management: Pt listens to the calm app.   Encouraged to aim for 8 hours of sleep per night. Aim to go to sleep nightly at the same time, limit screen time 1 hour before bed.        Labs ordered:     AOMs are contraindicated in pregnancy.  Contraception:  tubal ligation

## 2024-06-08 ENCOUNTER — Ambulatory Visit: Admit: 2024-06-08 | Discharge: 2024-06-09 | Payer: BLUE CROSS/BLUE SHIELD | Attending: Family | Primary: Family

## 2024-06-08 DIAGNOSIS — Z8679 Personal history of other diseases of the circulatory system: Principal | ICD-10-CM

## 2024-06-08 DIAGNOSIS — Z6829 Body mass index (BMI) 29.0-29.9, adult: Principal | ICD-10-CM

## 2024-06-08 DIAGNOSIS — Z8639 Personal history of other endocrine, nutritional and metabolic disease: Principal | ICD-10-CM

## 2024-06-08 DIAGNOSIS — Z7689 Persons encountering health services in other specified circumstances: Principal | ICD-10-CM

## 2024-06-08 DIAGNOSIS — Z903 Acquired absence of stomach [part of]: Principal | ICD-10-CM

## 2024-06-08 MED ORDER — ZEPBOUND 10 MG/0.5 ML SUBCUTANEOUS PEN INJECTOR
SUBCUTANEOUS | 6 refills | 28.00000 days | Status: CP
Start: 2024-06-08 — End: 2024-06-30

## 2024-06-08 NOTE — Patient Instructions (Signed)
 It was great seeing you in clinic today! Please see below for an overview of what we discussed:     Plan: No changes today.    Goal:  Health improvement  Things to think about to help me reach my goal:                   What are you going to do? Add one day of additional weight resistance activities.   How and how much?    How frequent?    Barriers to success?    Solutions to barriers?      Please alert the clinic if you are having rapid weight loss, such as > 2 pounds/week.    Nutrition: Work on a goal of 15-30g of protein per meal. Many patients find an app to be a helpful tool to accomplish this (MyFitnessPal, LoseIt, Cronometer, etc).  Our clinic nutritionist, Eleanor Necessary, is available for appointments. We also have an appetite awareness counselor that is available for appointments.  Additional tips: Add more fruits and vegetables to meals    Physical activity: Try to be physically active most days of the week. Consider weight resistance activities twice weekly.  Explore the following website for instructional exercise resources: https://uncwellness.com/programs/videos/    Sleep: Work on a sleep goal of 7-8 hours per night. We can provide you information on sleep hygiene.     Referrals: You will be contacted to schedule appointments for any referrals placed.     Refills: Please call your pharmacy a week before you need your next dose of medicine.    The medications used in our clinic are not considered safe in pregnancy. Please address any concerns with your primary care provider.    Urgent Concerns: You should be seen for worsening of your acute or chronic medical concerns. For Life Threatening Emergencies-- call 911 and go to the Emergency Department.     For after hours and weekend concerns:   [facility] for after-hours nurse advice at [phone]    [facility] Locations:   [email or web address]    Lab results:    Labs: If you have labs drawn, you will be notified through the MyChart Portal when they have been reviewed.     [email or web address]    Re-check any abnormalities with your primary care provider.    Nutrition:       Copyright ?? 2011, [employer]. For more information about The Healthy Eating Plate, please see The Nutrition Source, Department of Nutrition, [employer], www.thenutritionsource.org, and [employer], www.health.ukrank.hu. (Source: fuelstrike.hu)     Follow up:   Plan on follow up at least every 3 months. Please make sure you are up to date with your primary care provider.                Learning About the Mediterranean Diet  What is the Mediterranean diet?     The Mediterranean diet is a style of eating rather than a diet plan. It features foods eaten in [place], [place], southern [place] and [place], and other countries along the [employer]. It emphasizes eating foods like fish, fruits, vegetables, beans, high-fiber breads and whole grains, nuts, and olive oil. This style of eating includes limited red meat, cheese, and sweets.  Why choose the Mediterranean diet?  A Mediterranean-style diet may improve heart health. It contains more fat than other heart-healthy diets. But the fats are mainly from nuts, unsaturated oils (such as fish oils and olive  oil), and certain nut or seed oils (such as canola, soybean, or flaxseed oil). These fats may help protect the heart and blood vessels.  How can you get started on the Mediterranean diet?  Here are some things you can do to switch to a more Mediterranean way of eating.  What to eat  Eat a variety of fruits and vegetables each day, such as grapes, blueberries, tomatoes, broccoli, peppers, figs, olives, spinach, eggplant, beans, lentils, and chickpeas.  Eat a variety of whole-grain foods each day, such as oats, brown rice, and whole wheat bread, pasta, and couscous.  Eat fish at least 2 times a week. Try tuna, salmon, mackerel, lake trout, herring, or sardines.  Eat moderate amounts of low-fat dairy products, such as milk, cheese, or yogurt.  Eat moderate amounts of poultry and eggs.  Choose healthy (unsaturated) fats, such as nuts, olive oil, and certain nut or seed oils like canola, soybean, and flaxseed.  Limit unhealthy (saturated) fats, such as butter, palm oil, and coconut oil. And limit fats found in animal products, such as meat and dairy products made with whole milk. Try to eat red meat only a few times a month in very small amounts.  Limit sweets and desserts to only a few times a week. This includes sugar-sweetened drinks like soda.  The Mediterranean diet may also include red wine with your meal--1 glass each day for women and up to 2 glasses a day for men.  Tips for eating at home  Use herbs, spices, garlic, lemon zest, and citrus juice instead of salt to add flavor to foods.  Add avocado slices to your sandwich instead of bacon.  Have fish for lunch or dinner instead of red meat. Brush the fish with olive oil, and broil or grill it.  Sprinkle your salad with seeds or nuts instead of cheese.  Cook with olive or canola oil instead of butter or oils that are high in saturated fat.  Switch from 2% milk or whole milk to 1% or fat-free milk.  Dip raw vegetables in a vinaigrette dressing or hummus instead of dips made from mayonnaise or sour cream.  Have a piece of fruit for dessert instead of a piece of cake. Try baked apples, or have some dried fruit.  Tips for eating out  Try broiled, grilled, baked, or poached fish instead of having it fried or breaded.  Ask your server to have your meals prepared with olive oil instead of butter.  Order dishes made with marinara sauce or sauces made from olive oil. Avoid sauces made from cream or mayonnaise.  Choose whole-grain breads, whole wheat pasta and pizza crust, brown rice, beans, and lentils.  Cut back on butter or margarine on bread. Instead, you can dip your bread in a small amount of olive oil.  Ask for a side salad or grilled vegetables instead of french fries or chips.  Where can you learn more?  Go to My[facility]Chart at https://myuncchart.[employer]/support/administrative Officer in the Menu. Enter O407 in the search box to learn more about Learning About the Mediterranean Diet.  Current as of: February 21, 2022  Content Version: 14.1  ?? 2006-2024 Healthwise, Incorporated.   Care instructions adapted under license by [facility]. If you have questions about a medical condition or this instruction, always ask your healthcare professional. Healthwise, Incorporated disclaims any warranty or liability for your use of this information.         Estimation of protein content (Per  Google.com, accessed on 06/18/23):  Kidney beans: 1 cup, 43 gm protein  Baked beans: 1 cup, 14 gm protein  Pinto beans: 1 cup, 41 gm protein  Broccoli: 1 cup, 2.6 gm protein  Chickpeas: 1 cup, 39 gm protein  Edamame: 1 cup, 17 gm protein  Lentils: 1 cup, 18 gm protein  Nut butter: 2 tbsp*, 8 gm protein  Mixed nuts: 1 cup, 27 gm protein  Oats: 1 cup, 11 gm protein  Peas: 1 cup, 8 gm  protein  Quinoa: 1 cup cooked, 8 gm protein  Sorghum: 1 cup cooked 22 gm protein  Soy milk: 1 cup, 8 gm protein  Spinach: 1 cup, ~1 gm protein  Tempeh: 1 cup, 31 gm protein  Tofu: 1/2 cup*, 10 gm protein  Veggie pattie: 1 pattie, 11 gm protein       Recommendations from AI:

## 2024-06-08 NOTE — Progress Notes (Signed)
 Body composition scale used today, measurements obtained. Print out scanned into media tab.

## 2024-06-09 ENCOUNTER — Encounter: Payer: Self-pay | Admitting: Physical Therapy

## 2024-06-09 ENCOUNTER — Ambulatory Visit: Attending: Physical Medicine & Rehabilitation | Admitting: Physical Therapy

## 2024-06-09 DIAGNOSIS — M6281 Muscle weakness (generalized): Secondary | ICD-10-CM | POA: Diagnosis present

## 2024-06-09 DIAGNOSIS — M542 Cervicalgia: Secondary | ICD-10-CM | POA: Diagnosis present

## 2024-06-09 DIAGNOSIS — M5412 Radiculopathy, cervical region: Secondary | ICD-10-CM | POA: Insufficient documentation
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
## 2024-06-11 DIAGNOSIS — Z8679 Personal history of other diseases of the circulatory system: Principal | ICD-10-CM

## 2024-06-11 DIAGNOSIS — Z6829 Body mass index (BMI) 29.0-29.9, adult: Principal | ICD-10-CM

## 2024-06-11 DIAGNOSIS — Z8639 Personal history of other endocrine, nutritional and metabolic disease: Principal | ICD-10-CM

## 2024-06-12 ENCOUNTER — Ambulatory Visit
Admit: 2024-06-12 | Discharge: 2024-06-13 | Payer: BLUE CROSS/BLUE SHIELD | Attending: Anesthesiology | Primary: Anesthesiology

## 2024-06-12 DIAGNOSIS — J4521 Mild intermittent asthma with (acute) exacerbation: Principal | ICD-10-CM

## 2024-06-12 DIAGNOSIS — Z1159 Encounter for screening for other viral diseases: Principal | ICD-10-CM

## 2024-06-12 DIAGNOSIS — Z1231 Encounter for screening mammogram for malignant neoplasm of breast: Principal | ICD-10-CM

## 2024-06-12 DIAGNOSIS — Z0000 Encounter for general adult medical examination without abnormal findings: Principal | ICD-10-CM

## 2024-06-12 DIAGNOSIS — R17 Unspecified jaundice: Principal | ICD-10-CM

## 2024-06-12 DIAGNOSIS — Z13220 Encounter for screening for lipoid disorders: Principal | ICD-10-CM

## 2024-06-12 DIAGNOSIS — N920 Excessive and frequent menstruation with regular cycle: Principal | ICD-10-CM

## 2024-06-12 DIAGNOSIS — Z136 Encounter for screening for cardiovascular disorders: Principal | ICD-10-CM

## 2024-06-12 DIAGNOSIS — R030 Elevated blood-pressure reading, without diagnosis of hypertension: Principal | ICD-10-CM

## 2024-06-12 DIAGNOSIS — J4540 Moderate persistent asthma, uncomplicated: Principal | ICD-10-CM

## 2024-06-12 DIAGNOSIS — M5412 Radiculopathy, cervical region: Principal | ICD-10-CM

## 2024-06-12 MED ORDER — BUDESONIDE-FORMOTEROL HFA 80 MCG-4.5 MCG/ACTUATION AEROSOL INHALER
Freq: Two times a day (BID) | RESPIRATORY_TRACT | 11 refills | 31.00000 days | Status: CP
Start: 2024-06-12 — End: ?

## 2024-06-12 MED ORDER — ALBUTEROL SULFATE HFA 90 MCG/ACTUATION AEROSOL INHALER
RESPIRATORY_TRACT | 11 refills | 16.00000 days | Status: CP | PRN
Start: 2024-06-12 — End: 2025-06-12

## 2024-06-12 MED ORDER — AMLODIPINE 5 MG TABLET
ORAL_TABLET | Freq: Every day | ORAL | 3 refills | 90.00000 days | Status: CP
Start: 2024-06-12 — End: ?
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
## 2024-06-12 NOTE — Progress Notes (Signed)
 Reviewed with patient, their AVS can be viewed through their My Chart.  Pt voiced understanding

## 2024-06-12 NOTE — Progress Notes (Signed)
 Assessment and Plan:     Assessment & Plan  General Health Maintenance  Routine health maintenance up to date. Mammogram due in May. Colonoscopy not needed until 2033. PAP UTD. Lipid panel due for re-evaluation.  - Schedule mammogram in May.  - Plan lipid panel re-evaluation with other lab work.  - Continue routine health maintenance screenings.    Cervical radiculopathy  Chronic neck pain with finger tingling due to nerve compression. Physical therapy initiated, symptoms persist. Injection therapy/pain management planned to avoid surgery if possible.  - Continue physical therapy exercises.  - Proceed with injection therapy/pain management.     Moderate persistent asthma    Asthma well-managed with current medications. Advised to avoid large gatherings and sick individuals due to high flu activity.  - Continue current asthma medications, including Symbicort .  - Avoid large gatherings and sick individuals.    Hypertension  Blood pressure well-controlled with current medication.  - Continue amlodipine .  - Sent prescription for a year's supply of amlodipine .    Barriers to recommended plan: None identified    No follow-ups on file.      Subjective:     HPI: Frances Hernandez is a 49 y.o. female here for a health maintenance exam.      History of Present Illness  Frances Hernandez is a 49 year old female who presents for follow-up on her physical therapy and pain management for neck pain.    She has been experiencing worsening neck pain following a physical therapy session on Friday, which has intensified due to irregular exercise adherence. She continues to experience tingling in her fingers. She reports that the physical therapist noticed weakness in her hand and gave her exercises to address this.    She attends physical therapy sessions twice a week and has informed her boss about these appointments. She has FMLA coverage for these sessions, which she uses to manage her time off work.    She is currently using Symbicort  for asthma management and is on amlodipine  for blood pressure management.    LMP:  No LMP recorded. (Menstrual status: Other).  Last Pap:  Pap Smear date: 01/09/2023  Last Mammogram:  Mammogram date: 10/31/2023  Last DEXA:  DEXA date: Not Found  Last Colorectal Cancer Screening: 2023  No order of COLORECTAL CANCER DNA + FIT is found.    No results found for: OCCULTBLD      I have reviewed past medical, surgical, medications, allergies, social and family histories today and updated them in Epic where appropriate.      Review of systems negative unless otherwise noted as per HPI.      Objective:     Vitals:    06/12/24 1001   BP: 126/72   Pulse: 71   Resp: 18   Temp: 36.9 ??C (98.5 ??F)   SpO2: 100%     Body mass index is 30.84 kg/m??.    General Appearance: Alert, cooperative, no distress, appears stated age.   Head and Neck: Normocephalic, atraumatic. No masses. No thyromegaly.  EENT: Eyes: PERRLA, Conjuntiva clear; Ears: bilateral TMs normal; Nose: septum and turbinates unremarkable; Throat: mucus membranes moist. No tonsilar enlargement or exudates. No OP erythema.  Cardiovascular:  Regular rate and rhythm. No murmurs  Respiratory: Lungs clear to auscultation bilaterally,  Respiratory effort unremarkable. No wheezes or rhonchi.    Musculoskeletal: Gait and station unremarkable. Normal ROM. Normal strength and tone.   Extremities: No edema. Peripheral pulses normal  Integumentary: Warm and dry.No  rashes.  No abnormal appearing skin lesions  Neurologic: Alert and oriented to place and time. Normal deep tendon reflexes.   Psychiatric: Mood and affect appropriate for situation.       Medication adherence and barriers to the treatment plan have been addressed. Opportunities to optimize healthy behaviors have been discussed. Patient / caregiver voiced understanding.   Note - This record has been created using [employer]. Chart creation errors have been sought, but may not always have been located. Such creation errors do not reflect on the standard of medical care.     Con JAYSON Dawn, MD

## 2024-06-15 ENCOUNTER — Telehealth: Payer: Self-pay | Admitting: Physical Therapy

## 2024-06-15 ENCOUNTER — Ambulatory Visit: Payer: Self-pay | Admitting: Physical Therapy

## 2024-06-15 ENCOUNTER — Encounter
Admit: 2024-06-15 | Discharge: 2024-06-15 | Payer: BLUE CROSS/BLUE SHIELD | Attending: Family Medicine | Primary: Family Medicine

## 2024-06-15 DIAGNOSIS — J069 Acute upper respiratory infection, unspecified: Principal | ICD-10-CM

## 2024-06-15 DIAGNOSIS — U071 COVID: Principal | ICD-10-CM

## 2024-06-15 MED ORDER — PAXLOVID 300 MG (150 MG X 2)-100 MG TABLETS IN A DOSE PACK
ORAL_TABLET | ORAL | 0 refills | 0.00000 days | Status: CP
Start: 2024-06-15 — End: ?
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
## 2024-06-15 NOTE — Telephone Encounter (Signed)
 Flu, covid, rsv test ordered per patient's request

## 2024-06-15 NOTE — Telephone Encounter (Signed)
 Result received for positive COVID-19 testing.  Phone call outreach to patient via telephone ~ 5 pm on 06/15/2024, unable to reach, message left notifying of result.    Results forwarded to PCP.  Messaging to PCP.    Alm Eagles, MD  [facility]  [address]  [place], [place] [address]  Phone [phone]

## 2024-06-15 NOTE — Telephone Encounter (Signed)
 LVM notifying patient of missed PT visit scheduled at 9am today. Requested they call back to reschedule, confirm next appointment, or let us  know of any changes in PT plans. Let patient know that with any no-show I am required to review our cancellation policy that after 2 no-shows we remove future visits from the schedule, they are responsible to calling in to reschedule, and they will only be able to schedule one appointment at a time.  Camie SAUNDERS. Juli, PT, DPT 06/15/2024, 2:42 PM  [facility] [address] [place], [place] [address] P: [phone] I F: [phone]

## 2024-06-17 ENCOUNTER — Ambulatory Visit: Payer: Self-pay | Admitting: Physical Therapy

## 2024-06-18 NOTE — Progress Notes (Signed)
 Provider: OLAM DELENA MILL, MD  Division: [facility]    Virtual Video Encounter        The patient reports they are physically located in [name]  and is currently: at home. I conducted a audio/video visit. I spent  14m 29s on the video call with the patient. I spent an additional 22 minutes on pre- and post-visit activities on the date of service .              Assessment/Plan:        Assessment & Plan  Perimenopausal  Adhesive issues w/transdermal MHT       Hormone replacement therapy          -- apply w/tegaderm    Follow up: prn   Follow-up requests from provider: can schedule telephone or virtual video visit during COVID-19  Patient will call the clinic or use MyChart should anything change or any new issues arise.    Subjective     Chief Complaint: Frances Hernandez presents for this follow-up Virtual Video visit    History of Present Illness:n The Combipatch  adhesive is failing; falls off shortly after placing.      The following portions of the patient's history were reviewed and updated as appropriate: allergies, current medications, past family history, past medical history, past social history, past surgical history, and problem list.      Objective:   No new data for view        Medical Decision Making:     25 minutes was spent on this medically necessary service. Pt visit by Virtual Video in the setting of State of Emergency due to COVID-19 Pandemic.     _________________________________________________

## 2024-06-20 NOTE — Assessment & Plan Note (Signed)
 Adhesive issues w/transdermal MHT

## 2024-06-21 ENCOUNTER — Encounter: Payer: Self-pay | Admitting: Physical Therapy

## 2024-06-22 ENCOUNTER — Ambulatory Visit: Payer: Self-pay | Admitting: Physical Therapy

## 2024-06-24 ENCOUNTER — Ambulatory Visit: Payer: Self-pay | Admitting: Physical Therapy
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
## 2024-06-24 NOTE — Progress Notes (Signed)
 PA form for Zepbound  10mg  faxed to [employer] @ [phone]. Chart notes attached.

## 2024-06-29 ENCOUNTER — Ambulatory Visit: Payer: Self-pay | Admitting: Physical Therapy

## 2024-06-29 NOTE — Telephone Encounter (Signed)
 Called patient and left message advising Dr. Dasie wanted to get her scheduled appointment to discuss symptoms. Informed patient give office call back to schedule next available appointment.

## 2024-07-02 ENCOUNTER — Ambulatory Visit: Payer: Self-pay | Admitting: Physical Therapy

## 2024-07-02 NOTE — Patient Instructions (Incomplete)
 POST PROCEDURE INSTRUCTIONS   You may apply an ice pack 20-30 minutes at a time to the injection site if you experience soreness.     Keep the injection site clean and dry. You make remove the band-aid one day following the procedure.     You may take a shower but AVOID getting in to baths, pools or whirlpools for 48 HOURS AFTER THE PROCEDURE.     Remember that it takes a few days, even up to a week for the steroid medicine to reduce inflammation and pain. If you are diabetic, the steroid medicine may cause your blood sugar levels to increase. Please contact your internist regarding medication adjustments.    ACTIVITY   Refrain from heavy activity for the next 24 to 48 hours. General walking is okay. You may resume your normal activities the day following the procedure.   You may start or resume your individualized exercise program or physical therapy 48 hours after the procedure.   MEDICATIONS   Please note that it is okay to continue other prescribed medications (blood pressure, insulin, water pill, depression/anxiety pill, etc.) as well as other prescribed pain medications such as Neurontin, Lyrical, Celebrex, Ultram, Vicodin, Norco and acetaminophen (Tylenol).   SIDE EFFECTS   Increase in pain during the first 24 to 48 hours.     Trouble sleeping during the first one or two nights after the procedure-this is an effect of the steroid medicine.     Facial and or chest flushing (it feels like you have a fever, but you do not) - this is an effect of the steroid medicine.   May have a headache      You might experience:   1. Mild to moderate swelling at the joint.   2. Possible bruising at the injection site.     WHEN TO CALL THE DOCTOR/NURSE   Severe pain, worse or different that the pain you had before the procedure.     Fever or chills.     Redness, or swelling around the injection site.     Call the Pain Management  Procedural Nurses [phone] during normal business hours (7 am-3 pm). If it is AFTER HOURS or during a weekend or holiday, call the hospital operator and ask for the Anesthesia Pain physician on call at [phone].   FOR EMERGENCIES, CALL 911 OR GO TO THE NEAREST HOSPITAL EMERGENCY DEPARTMENT.   ?   TO SCHEDULE APPOINTMENTS OR FOR QUESTIONS RELATED TO MEDICATIONS   Call the Pain Management Clinic at [phone]

## 2024-07-07 NOTE — Progress Notes (Signed)
 PA denied for Zepbound . Denial letter in media. Patient notified.

## 2024-07-13 ENCOUNTER — Ambulatory Visit: Admitting: Physical Therapy

## 2024-07-16 ENCOUNTER — Ambulatory Visit: Admitting: Physical Therapy

## 2024-07-21 ENCOUNTER — Ambulatory Visit: Admitting: Physical Therapy

## 2024-07-27 ENCOUNTER — Ambulatory Visit: Admitting: Physical Therapy

## 2024-07-29 ENCOUNTER — Ambulatory Visit: Admitting: Physical Therapy

## 2024-08-02 MED ORDER — ZEPBOUND 7.5 MG/0.5 ML SUBCUTANEOUS PEN INJECTOR
SUBCUTANEOUS | 0 refills | 28.00000 days
Start: 2024-08-02 — End: 2024-08-24

## 2024-08-30 MED ORDER — ZEPBOUND 10 MG/0.5 ML SUBCUTANEOUS PEN INJECTOR
SUBCUTANEOUS | 0 refills | 28.00000 days
Start: 2024-08-30 — End: 2024-09-21
